# Patient Record
Sex: Female | Born: 1960
Health system: Southern US, Community
[De-identification: ages and names within clinical notes are randomized; demographics above are authoritative.]

## PROBLEM LIST (undated history)

## (undated) DIAGNOSIS — N809 Endometriosis, unspecified: Secondary | ICD-10-CM

## (undated) DIAGNOSIS — R51 Headache: Secondary | ICD-10-CM

## (undated) DIAGNOSIS — G43909 Migraine, unspecified, not intractable, without status migrainosus: Secondary | ICD-10-CM

## (undated) DIAGNOSIS — I219 Acute myocardial infarction, unspecified: Secondary | ICD-10-CM

## (undated) DIAGNOSIS — K649 Unspecified hemorrhoids: Secondary | ICD-10-CM

## (undated) DIAGNOSIS — K219 Gastro-esophageal reflux disease without esophagitis: Secondary | ICD-10-CM

## (undated) DIAGNOSIS — R519 Headache, unspecified: Secondary | ICD-10-CM

## (undated) HISTORY — DX: Unspecified hemorrhoids: K64.9

## (undated) HISTORY — DX: Migraine, unspecified, not intractable, without status migrainosus: G43.909

## (undated) HISTORY — PX: TUBAL LIGATION: SHX77

## (undated) HISTORY — DX: Endometriosis, unspecified: N80.9

---
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
## 1988-11-23 HISTORY — PX: LYSIS OF ADHESION: SHX5961

## 2006-06-17 ENCOUNTER — Encounter: Admission: RE | Admit: 2006-06-17 | Discharge: 2006-06-17 | Payer: Self-pay | Admitting: Family Medicine

## 2006-08-16 ENCOUNTER — Ambulatory Visit: Payer: Self-pay | Admitting: Family Medicine

## 2006-10-19 ENCOUNTER — Ambulatory Visit: Payer: Self-pay

## 2006-10-25 ENCOUNTER — Ambulatory Visit: Payer: Self-pay

## 2007-12-15 ENCOUNTER — Ambulatory Visit: Payer: Self-pay

## 2008-04-18 ENCOUNTER — Ambulatory Visit: Payer: Self-pay | Admitting: Obstetrics and Gynecology

## 2008-05-07 ENCOUNTER — Inpatient Hospital Stay: Payer: Self-pay | Admitting: Obstetrics and Gynecology

## 2008-05-07 HISTORY — PX: ABDOMINAL HYSTERECTOMY: SHX81

## 2010-05-28 ENCOUNTER — Ambulatory Visit: Payer: Self-pay

## 2011-05-07 ENCOUNTER — Ambulatory Visit: Payer: Self-pay | Admitting: Family Medicine

## 2011-06-02 ENCOUNTER — Other Ambulatory Visit: Payer: Self-pay | Admitting: Unknown Physician Specialty

## 2011-06-09 ENCOUNTER — Ambulatory Visit: Payer: Self-pay | Admitting: Unknown Physician Specialty

## 2011-06-24 LAB — HM COLONOSCOPY

## 2011-09-16 ENCOUNTER — Ambulatory Visit: Payer: Self-pay | Admitting: Family Medicine

## 2012-04-14 ENCOUNTER — Ambulatory Visit: Payer: Self-pay | Admitting: Obstetrics and Gynecology

## 2012-04-14 LAB — HEMOGLOBIN: HGB: 13.9 g/dL (ref 12.0–16.0)

## 2012-04-14 LAB — BASIC METABOLIC PANEL
Calcium, Total: 8.7 mg/dL (ref 8.5–10.1)
Chloride: 105 mmol/L (ref 98–107)
Co2: 25 mmol/L (ref 21–32)
Creatinine: 0.61 mg/dL (ref 0.60–1.30)
EGFR (Non-African Amer.): >60
Glucose: 78 mg/dL (ref 65–99)
Sodium: 139 mmol/L (ref 136–145)

## 2012-04-23 HISTORY — PX: OOPHORECTOMY: SHX86

## 2012-04-25 ENCOUNTER — Ambulatory Visit: Payer: Self-pay | Admitting: Obstetrics and Gynecology

## 2013-05-01 ENCOUNTER — Ambulatory Visit: Payer: Self-pay | Admitting: Obstetrics and Gynecology

## 2013-08-10 ENCOUNTER — Ambulatory Visit: Payer: Self-pay | Admitting: Otolaryngology

## 2014-06-05 ENCOUNTER — Ambulatory Visit: Payer: Self-pay | Admitting: Obstetrics and Gynecology

## 2014-06-07 ENCOUNTER — Ambulatory Visit: Payer: Self-pay | Admitting: Obstetrics and Gynecology

## 2015-03-17 NOTE — Op Note (Signed)
PATIENT NAME:  Crystal Higgins, Crystal Higgins MR#:  [id number] DATE OF BIRTH:  July 18, 1961  DATE OF PROCEDURE:  04/25/2012  PREOPERATIVE DIAGNOSIS: Chronic left pelvic pain.   POSTOPERATIVE DIAGNOSES:  1. Chronic left pelvic pain.   2. Extensive pelvic and abdominal adhesions.   PROCEDURES:  1. Laparoscopic left salpingo-oophorectomy.  2. Laparoscopic adhesiolysis incorporating 80% of operating time.   ANESTHESIA: General endotracheal anesthesia.   SURGEON: Boykin Nearing, MD   FIRST ASSISTANT: Ricky L. Amalia Hailey, MD   INDICATIONS: This is a 54 year old gravida 3 para 3 patient with a several month history of left lower quadrant pain. The patient has had extensive surgeries including three Cesarean sections and abdominal hysterectomy.   FINDINGS: Significant abdominal and pelvic adhesions incorporating thick omental adhesions to the anterior abdominal wall, loop of small bowel also tethered and adhesed to the anterior abdominal wall noted.   PROCEDURE: After adequate general endotracheal anesthesia, the patient was placed in the dorsal supine position with the legs in the [place] stirrups. The patient's abdomen and vagina were prepped in a normal sterile fashion. Bladder was drained with a Foley catheter yielding 100 mL of urine. The patient was draped in normal sterile fashion. A sponge was placed in the vagina to be used for manipulation of the bowel during the procedure. A 15 mm infraumbilical incision was made after injecting with 0.5% Marcaine. The laparoscope was advanced into the abdominal cavity without difficulty utilizing the Optiview cannula. The patient's abdomen was insufflated with carbon dioxide. There were thick omental adhesions noted immediately around the infraumbilical port site and to the left lower quadrant and right mid quadrant. A 10 mm port site was then advanced into the abdominal cavity 3 cm medial to the right anterior iliac spine. Direct visualization was used. A 5 mm port was  advanced to the left lower quadrant again approximately 3 cm medial to the anterior iliac spine. Direct visualization was used to place these ports. Again, multiple omental adhesions were noted. Harmonic scalpel was brought up. 80% of the operating time entailed removal of the omental adhesions and dissecting small bowel loops from the anterior abdominal wall. Once the majority of the adhesions were freed, the right ovary was noted. It did not have any significant pathology. The ovary and fallopian tube was placed on medial traction and the infundibulopelvic ligament was cauterized with Kleppingers and then the Harmonic was brought across the base. Left fallopian tube and ovary was then placed into the Endopouch and was removed through the right port site. The patient's right fallopian tube appeared somewhat distorted with some scar tissue and possible hydrosalpinx. Pathology was not significant enough to consider removal of the right tube and ovary since her pain was isolated to the left. The patient's abdomen was copiously irrigated and suctioned. The ureters appeared normal bilaterally with normal peristaltic activity. Good hemostasis was noted. The patient's abdomen was deflated to 6 mmHg. Again, good hemostasis was noted and the procedure was terminated. Port sites removed. The infraumbilical and the right lower port sites were closed with deep 2-0 Vicryl fascial layer and all three port sites were closed with interrupted 4-0 Vicryl. Steri-Strips applied. Sterile dressing applied. Sponge stick was removed. There were no complications.   Estimated blood loss 15 mL. Urine output 150 mL. Intraoperative fluids 700 mL. The Foley catheter was removed at the end of the case. The patient was taken to the recovery room in good condition.   ____________________________ Boykin Nearing, MD tjs:drc D: 04/25/2012 10:43:30 ET  T: 04/25/2012 10:59:50 ET JOB#: [id number]  cc: Boykin Nearing, MD,  <Dictator> Boykin Nearing MD ELECTRONICALLY SIGNED 04/25/2012 16:53

## 2015-04-29 ENCOUNTER — Encounter: Payer: Self-pay | Admitting: *Deleted

## 2015-04-29 ENCOUNTER — Inpatient Hospital Stay: Admission: RE | Admit: 2015-04-29 | Payer: Self-pay | Source: Ambulatory Visit

## 2015-04-29 DIAGNOSIS — Z79899 Other long term (current) drug therapy: Secondary | ICD-10-CM | POA: Diagnosis not present

## 2015-04-29 DIAGNOSIS — Z8249 Family history of ischemic heart disease and other diseases of the circulatory system: Secondary | ICD-10-CM | POA: Diagnosis not present

## 2015-04-29 DIAGNOSIS — Z90710 Acquired absence of both cervix and uterus: Secondary | ICD-10-CM | POA: Diagnosis not present

## 2015-04-29 DIAGNOSIS — Z880 Allergy status to penicillin: Secondary | ICD-10-CM | POA: Diagnosis not present

## 2015-04-29 DIAGNOSIS — Z800 Family history of malignant neoplasm of digestive organs: Secondary | ICD-10-CM | POA: Diagnosis not present

## 2015-04-29 DIAGNOSIS — Z833 Family history of diabetes mellitus: Secondary | ICD-10-CM | POA: Diagnosis not present

## 2015-04-29 DIAGNOSIS — K648 Other hemorrhoids: Secondary | ICD-10-CM | POA: Diagnosis not present

## 2015-04-29 DIAGNOSIS — Z801 Family history of malignant neoplasm of trachea, bronchus and lung: Secondary | ICD-10-CM | POA: Diagnosis not present

## 2015-04-29 DIAGNOSIS — Z885 Allergy status to narcotic agent status: Secondary | ICD-10-CM | POA: Diagnosis not present

## 2015-04-29 DIAGNOSIS — K644 Residual hemorrhoidal skin tags: Secondary | ICD-10-CM | POA: Diagnosis not present

## 2015-04-29 NOTE — Patient Instructions (Signed)
  Your procedure is scheduled on:05/07/15 Report to Day Surgery. To find out your arrival time please call [phone] between 1PM - 3PM on 05/06/15.  Remember: Instructions that are not followed completely may result in serious medical risk, up to and including death, or upon the discretion of your surgeon and anesthesiologist your surgery may need to be rescheduled.    _x___ 1. Do not eat food or drink liquids after midnight. No gum chewing or hard candies.     __x__ 2. No Alcohol for 24 hours before or after surgery.   ____ 3. Bring all medications with you on the day of surgery if instructed.    _x___ 4. Notify your doctor if there is any change in your medical condition     (cold, fever, infections).     Do not wear jewelry, make-up, hairpins, clips or nail polish.  Do not wear lotions, powders, or perfumes. You may wear deodorant.  Do not shave 48 hours prior to surgery. Men may shave face and neck.  Do not bring valuables to the hospital.    [facility] is not responsible for any belongings or valuables.               Contacts, dentures or bridgework may not be worn into surgery.  Leave your suitcase in the car. After surgery it may be brought to your room.  For patients admitted to the hospital, discharge time is determined by your                treatment team.   Patients discharged the day of surgery will not be allowed to drive home.   Please read over the following fact sheets that you were given:   Surgical Site Infection Prevention   ____ Take these medicines the morning of surgery with A SIP OF WATER:    1.   2.   3.   4.  5.  6.  ____ Fleet Enema (as directed)   _x___ Use CHG Soap as directed  ____ Use inhalers on the day of surgery  ____ Stop metformin 2 days prior to surgery    ____ Take 1/2 of usual insulin dose the night before surgery and none on the morning of surgery.   ____ Stop Coumadin/Plavix/aspirin on  ____ Stop Anti-inflammatories on     ____ Stop supplements until after surgery.    ____ Bring C-Pap to the hospital.

## 2015-05-01 ENCOUNTER — Encounter
Admission: RE | Admit: 2015-05-01 | Discharge: 2015-05-01 | Disposition: A | Discharge status: Home / Self Care | Payer: BC Managed Care – PPO | Source: Ambulatory Visit | Attending: Surgery | Admitting: Surgery

## 2015-05-01 DIAGNOSIS — K644 Residual hemorrhoidal skin tags: Secondary | ICD-10-CM | POA: Insufficient documentation

## 2015-05-01 DIAGNOSIS — Z01812 Encounter for preprocedural laboratory examination: Secondary | ICD-10-CM | POA: Insufficient documentation

## 2015-05-01 DIAGNOSIS — K648 Other hemorrhoids: Secondary | ICD-10-CM | POA: Diagnosis not present

## 2015-05-01 LAB — COMPREHENSIVE METABOLIC PANEL
ALBUMIN: 4.2 g/dL (ref 3.5–5.0)
ALT: 16 U/L (ref 14–54)
AST: 20 U/L (ref 15–41)
Alkaline Phosphatase: 44 U/L (ref 38–126)
Anion gap: 9 (ref 5–15)
BUN: 11 mg/dL (ref 6–20)
CALCIUM: 9.3 mg/dL (ref 8.9–10.3)
CHLORIDE: 105 mmol/L (ref 101–111)
CO2: 26 mmol/L (ref 22–32)
Creatinine, Ser: 0.65 mg/dL (ref 0.44–1.00)
GFR calc Af Amer: >60 mL/min (ref >60)
GFR calc non Af Amer: >60 mL/min (ref >60)
Glucose, Bld: 74 mg/dL (ref 65–99)
Potassium: 3.8 mmol/L (ref 3.5–5.1)
Sodium: 140 mmol/L (ref 135–145)
TOTAL PROTEIN: 7.1 g/dL (ref 6.5–8.1)
Total Bilirubin: 0.6 mg/dL (ref 0.3–1.2)

## 2015-05-01 LAB — DIFFERENTIAL
BASOS ABS: 0.2 10*3/uL — AB (ref 0–0.1)
BASOS PCT: 2 %
EOS PCT: 3 %
Eosinophils Absolute: 0.3 10*3/uL (ref 0–0.7)
Lymphocytes Relative: 28 %
Lymphs Abs: 2.8 10*3/uL (ref 1.0–3.6)
MONO ABS: 0.7 10*3/uL (ref 0.2–0.9)
Monocytes Relative: 7 %
NEUTROS ABS: 5.8 10*3/uL (ref 1.4–6.5)
Neutrophils Relative %: 60 %

## 2015-05-01 LAB — CBC
HCT: 41.2 % (ref 35.0–47.0)
HEMOGLOBIN: 13.6 g/dL (ref 12.0–16.0)
MCH: 28.2 pg (ref 26.0–34.0)
MCHC: 33 g/dL (ref 32.0–36.0)
MCV: 85.5 fL (ref 80.0–100.0)
Platelets: 344 10*3/uL (ref 150–440)
RBC: 4.82 MIL/uL (ref 3.80–5.20)
RDW: 13 % (ref 11.5–14.5)
WBC: 9.9 10*3/uL (ref 3.6–11.0)

## 2015-05-07 ENCOUNTER — Ambulatory Visit: Payer: BC Managed Care – PPO | Admitting: Anesthesiology

## 2015-05-07 ENCOUNTER — Encounter: Payer: Self-pay | Admitting: Surgery

## 2015-05-07 ENCOUNTER — Ambulatory Visit
Admission: RE | Admit: 2015-05-07 | Discharge: 2015-05-07 | Disposition: A | Discharge status: Home / Self Care | Payer: BC Managed Care – PPO | Source: Ambulatory Visit | Attending: Surgery | Admitting: Surgery

## 2015-05-07 ENCOUNTER — Encounter
Admission: RE | Disposition: A | Discharge status: Home / Self Care | Payer: Self-pay | Source: Ambulatory Visit | Attending: Surgery

## 2015-05-07 DIAGNOSIS — Z801 Family history of malignant neoplasm of trachea, bronchus and lung: Secondary | ICD-10-CM | POA: Insufficient documentation

## 2015-05-07 DIAGNOSIS — Z8249 Family history of ischemic heart disease and other diseases of the circulatory system: Secondary | ICD-10-CM | POA: Insufficient documentation

## 2015-05-07 DIAGNOSIS — Z885 Allergy status to narcotic agent status: Secondary | ICD-10-CM | POA: Insufficient documentation

## 2015-05-07 DIAGNOSIS — Z79899 Other long term (current) drug therapy: Secondary | ICD-10-CM | POA: Insufficient documentation

## 2015-05-07 DIAGNOSIS — Z833 Family history of diabetes mellitus: Secondary | ICD-10-CM | POA: Insufficient documentation

## 2015-05-07 DIAGNOSIS — Z800 Family history of malignant neoplasm of digestive organs: Secondary | ICD-10-CM | POA: Insufficient documentation

## 2015-05-07 DIAGNOSIS — K648 Other hemorrhoids: Secondary | ICD-10-CM | POA: Diagnosis not present

## 2015-05-07 DIAGNOSIS — Z90710 Acquired absence of both cervix and uterus: Secondary | ICD-10-CM | POA: Insufficient documentation

## 2015-05-07 DIAGNOSIS — K644 Residual hemorrhoidal skin tags: Secondary | ICD-10-CM | POA: Insufficient documentation

## 2015-05-07 DIAGNOSIS — Z880 Allergy status to penicillin: Secondary | ICD-10-CM | POA: Insufficient documentation

## 2015-05-07 HISTORY — DX: Acute myocardial infarction, unspecified: I21.9

## 2015-05-07 HISTORY — PX: HEMORRHOID SURGERY: SHX153

## 2015-05-07 HISTORY — DX: Headache, unspecified: R51.9

## 2015-05-07 HISTORY — DX: Headache: R51

## 2015-05-07 SURGERY — HEMORRHOIDECTOMY PROLAPSED
Anesthesia: General | Wound class: Clean Contaminated

## 2015-05-07 MED ORDER — ACETAMINOPHEN 10 MG/ML IV SOLN
INTRAVENOUS | Status: DC | PRN
Start: 2015-05-07 — End: 2015-05-07
  Administered 2015-05-07: 1000 mg via INTRAVENOUS

## 2015-05-07 MED ORDER — ONDANSETRON HCL 4 MG/2ML IJ SOLN
INTRAMUSCULAR | Status: AC
  Filled 2015-05-07: qty 2

## 2015-05-07 MED ORDER — OXYCODONE-ACETAMINOPHEN 5-325 MG PO TABS: 1.0000 | ORAL_TABLET | ORAL | Status: DC | PRN

## 2015-05-07 MED ORDER — FENTANYL CITRATE (PF) 100 MCG/2ML IJ SOLN
25.0000 ug | INTRAMUSCULAR | Status: DC | PRN
  Administered 2015-05-07 (×2): 25 ug via INTRAVENOUS

## 2015-05-07 MED ORDER — ONDANSETRON HCL 4 MG/2ML IJ SOLN
4.0000 mg | Freq: Once | INTRAMUSCULAR | Status: AC | PRN
  Administered 2015-05-07: 4 mg via INTRAVENOUS

## 2015-05-07 MED ORDER — BUPIVACAINE LIPOSOME 1.3 % IJ SUSP
INTRAMUSCULAR | Status: DC | PRN
  Administered 2015-05-07: 20 mL

## 2015-05-07 MED ORDER — PROPOFOL 10 MG/ML IV BOLUS
INTRAVENOUS | Status: DC | PRN
Start: 2015-05-07 — End: 2015-05-07
  Administered 2015-05-07: 150 mg via INTRAVENOUS
  Administered 2015-05-07: 50 mg via INTRAVENOUS

## 2015-05-07 MED ORDER — LACTATED RINGERS IV SOLN
INTRAVENOUS | Status: DC
  Administered 2015-05-07: 10:00:00 via INTRAVENOUS

## 2015-05-07 MED ORDER — BUPIVACAINE LIPOSOME 1.3 % IJ SUSP
INTRAMUSCULAR | Status: AC
  Filled 2015-05-07: qty 20

## 2015-05-07 MED ORDER — FAMOTIDINE 20 MG PO TABS
ORAL_TABLET | ORAL | Status: AC
  Filled 2015-05-07: qty 1

## 2015-05-07 MED ORDER — ACETAMINOPHEN 10 MG/ML IV SOLN
INTRAVENOUS | Status: AC
  Filled 2015-05-07: qty 100

## 2015-05-07 MED ORDER — MIDAZOLAM HCL 5 MG/5ML IJ SOLN
INTRAMUSCULAR | Status: DC | PRN
  Administered 2015-05-07: 2 mg via INTRAVENOUS

## 2015-05-07 MED ORDER — FENTANYL CITRATE (PF) 100 MCG/2ML IJ SOLN
INTRAMUSCULAR | Status: DC | PRN
Start: 2015-05-07 — End: 2015-05-07
  Administered 2015-05-07 (×2): 50 ug via INTRAVENOUS

## 2015-05-07 MED ORDER — ONDANSETRON HCL 4 MG/2ML IJ SOLN
INTRAMUSCULAR | Status: DC | PRN
Start: 2015-05-07 — End: 2015-05-07
  Administered 2015-05-07: 4 mg via INTRAVENOUS

## 2015-05-07 MED ORDER — LIDOCAINE HCL (CARDIAC) 20 MG/ML IV SOLN
INTRAVENOUS | Status: DC | PRN
  Administered 2015-05-07: 50 mg via INTRAVENOUS

## 2015-05-07 MED ORDER — BUPIVACAINE-EPINEPHRINE (PF) 0.25% -1:200000 IJ SOLN
INTRAMUSCULAR | Status: AC
  Filled 2015-05-07: qty 30

## 2015-05-07 MED ORDER — FENTANYL CITRATE (PF) 100 MCG/2ML IJ SOLN
INTRAMUSCULAR | Status: AC
Start: 2015-05-07 — End: 2015-05-07
  Administered 2015-05-07: 25 ug via INTRAVENOUS
  Filled 2015-05-07: qty 2

## 2015-05-07 MED ORDER — FAMOTIDINE 20 MG PO TABS
20.0000 mg | ORAL_TABLET | Freq: Once | ORAL | Status: AC
  Administered 2015-05-07: 20 mg via ORAL

## 2015-05-07 SURGICAL SUPPLY — 25 items
BLADE SURG 15 STRL LF DISP TIS (BLADE) ×1 IMPLANT
BLADE SURG 15 STRL SS (BLADE) ×1
CANISTER SUCT 1200ML W/VALVE (MISCELLANEOUS) ×2 IMPLANT
DRAPE LAPAROTOMY 100X77 ABD (DRAPES) ×2 IMPLANT
DRAPE LEGGINS SURG 28X43 STRL (DRAPES) ×2 IMPLANT
GAUZE SPONGE 4X4 12PLY STRL (GAUZE/BANDAGES/DRESSINGS) ×2 IMPLANT
GLOVE BIO SURGEON STRL SZ7.5 (GLOVE) ×10 IMPLANT
GOWN STRL REUS W/ TWL LRG LVL3 (GOWN DISPOSABLE) ×3 IMPLANT
GOWN STRL REUS W/TWL LRG LVL3 (GOWN DISPOSABLE) ×3
JELLY LUB 2OZ STRL (MISCELLANEOUS) ×1
JELLY LUBE 2OZ STRL (MISCELLANEOUS) ×1 IMPLANT
LABEL OR SOLS (LABEL) IMPLANT
NDL SAFETY 25GX1.5 (NEEDLE) ×2 IMPLANT
NEEDLE 18GX1X1/2 (RX/OR ONLY) (NEEDLE) ×2 IMPLANT
NS IRRIG 500ML POUR BTL (IV SOLUTION) ×2 IMPLANT
PACK BASIN MINOR ARMC (MISCELLANEOUS) ×2 IMPLANT
PAD GROUND ADULT SPLIT (MISCELLANEOUS) ×2 IMPLANT
PAD PREP 24X41 OB/GYN DISP (PERSONAL CARE ITEMS) ×2 IMPLANT
STAPLER PROXIMATE HCS (STAPLE) IMPLANT
STRAP SAFETY BODY (MISCELLANEOUS) ×2 IMPLANT
SUT CHROMIC 3 0 SH 27 (SUTURE) ×4 IMPLANT
SUT ETH BLK MONO 3 0 FS 1 12/B (SUTURE) IMPLANT
SUT PROLENE 2 0 SH DA (SUTURE) IMPLANT
SYR 20CC LL (SYRINGE) ×2 IMPLANT
SYRINGE 10CC LL (SYRINGE) ×2 IMPLANT

## 2015-05-07 NOTE — Anesthesia Postprocedure Evaluation (Signed)
  Anesthesia Post-op Note  Patient: Crystal Higgins  Procedure(s) Performed: Procedure(s): HEMORRHOIDECTOMY PROLAPSED (N/A)  Anesthesia type:General  Patient location: PACU  Post pain: Pain level controlled  Post assessment: Post-op Vital signs reviewed, Patient's Cardiovascular Status Stable, Respiratory Function Stable, Patent Airway and No signs of Nausea or vomiting  Post vital signs: Reviewed and stable  Last Vitals:  Filed Vitals:   05/07/15 1328  BP:   Pulse: 66  Temp:   Resp: 16    Level of consciousness: awake, alert  and patient cooperative  Complications: No apparent anesthesia complications

## 2015-05-07 NOTE — Anesthesia Preprocedure Evaluation (Signed)
Anesthesia Evaluation  Patient identified by MRN, date of birth, ID band Patient awake    Reviewed: Allergy & Precautions, NPO status , Patient's Chart, lab work & pertinent test results  History of Anesthesia Complications Negative for: history of anesthetic complications  Airway Mallampati: II  TM Distance: >3 FB Neck ROM: Full    Dental  (+) Teeth Intact, Chipped   Pulmonary          Cardiovascular +CHF (at 35 months old 2ndary to pcn dose)     Neuro/Psych    GI/Hepatic GERD- (pepcid OTC)  Medicated,  Endo/Other    Renal/GU      Musculoskeletal   Abdominal   Peds  Hematology   Anesthesia Other Findings   Reproductive/Obstetrics                             Anesthesia Physical Anesthesia Plan  ASA: II  Anesthesia Plan: General   Post-op Pain Management:    Induction: Intravenous  Airway Management Planned: LMA  Additional Equipment:   Intra-op Plan:   Post-operative Plan:   Informed Consent: I have reviewed the patients History and Physical, chart, labs and discussed the procedure including the risks, benefits and alternatives for the proposed anesthesia with the patient or authorized representative who has indicated his/her understanding and acceptance.     Plan Discussed with:   Anesthesia Plan Comments:         Anesthesia Quick Evaluation

## 2015-05-07 NOTE — Transfer of Care (Signed)
Immediate Anesthesia Transfer of Care Note  Patient: Crystal Higgins  Procedure(s) Performed: Procedure(s): HEMORRHOIDECTOMY PROLAPSED (N/A)  Patient Location: PACU  Anesthesia Type:General  Level of Consciousness: sedated  Airway & Oxygen Therapy: Patient Spontanous Breathing and Patient connected to face mask oxygen  Post-op Assessment: Report given to RN and Post -op Vital signs reviewed and stable  Post vital signs: Reviewed and stable  Last Vitals:  Filed Vitals:   05/07/15 1245  BP: 102/64  Pulse: 71  Temp:   Resp: 21    Complications: No apparent anesthesia complications

## 2015-05-07 NOTE — Progress Notes (Signed)
Oral airway upon arrival   Discontinued at 12:56

## 2015-05-07 NOTE — Op Note (Signed)
OPERATIVE REPORT  PREOPERATIVE  DIAGNOSIS: . Hemorrhoids  POSTOPERATIVE DIAGNOSIS: . Hemorrhoids  PROCEDURE: . Internal and external hemorrhoidectomy  ANESTHESIA:  General  SURGEON: Rochel Brome  MD   INDICATIONS: . She reports a history of pain and bleeding. She had physical findings of large internal and external hemorrhoids. Surgery was recommended for definitive treatment.  The patient was placed on the operating table in the supine position under general anesthesia. Legs were elevated into the lithotomy position supported with ankle straps the anal area was prepared with Betadine solution and draped with sterile towels and sheets  Initial inspection revealed multiple external hemorrhoids. Digital exam demonstrated no palpable mass. The anal canal was dilated large enough to admit 2 fingers. The bivalve anal retractor was introduced and further gently dilated the anal canal. There were large internal and external complexes found at the 4:00 and 8:00 and 11:00 positions. There were additional external hemorrhoids identified.  At the 4:00 position the internal component was suture ligated with 3-0 chromic. A V-shaped incision was made externally. The external hemorrhoid was dissected away from surrounding tissues using electrocautery for hemostasis. The dissection was carried up over the internal anal sphincter and dissected up to the previously placed suture ligature. The hemorrhoid was further ligated with 3-0 chromic and excised. Hemostasis appeared to be intact. The wound was closed with a running locked tied 3-0 chromic stitch leaving a small opening externally for drainage.  The  same procedure was carried out at the 8:00 position  The same procedure was carried out at the 11:00 position  At the 1:00 position an external hemorrhoid was excised and repaired with interrupted 3-0 chromic.  At the 6:00 position an external hemorrhoid was excised and repaired with 3-0 chromic.  The  site was further prepared with Betadine solution and infiltrated with 20 cc of Exparel  Dressings were applied with paper tape  The patient appeared to tolerate the procedure satisfactorily and was then prepared for transfer to the recovery room.    Rochel Brome M.D.

## 2015-05-07 NOTE — Discharge Instructions (Addendum)
Take Tylenol or Percocet as needed for pain.  Take stool softener 2 times per day until moving bowels satisfactorily. Later decrease to 1 per day as needed.  Remove dressing later today or tomorrow. Tuck gauze or pad in underwear for drainage and change as needed.  May shower and/or sitting in warm water.AMBULATORY SURGERY  DISCHARGE INSTRUCTIONS   1) The drugs that you were given will stay in your system until tomorrow so for the next 24 hours you should not:  A) Drive an automobile B) Make any legal decisions C) Drink any alcoholic beverage   2) You may resume regular meals tomorrow.  Today it is better to start with liquids and gradually work up to solid foods.  You may eat anything you prefer, but it is better to start with liquids, then soup and crackers, and gradually work up to solid foods.   3) Please notify your doctor immediately if you have any unusual bleeding, trouble breathing, redness and pain at the surgery site, drainage, fever, or pain not relieved by medication. 4)

## 2015-05-07 NOTE — H&P (Signed)
  She was seen preop. She reported no change in overall condition since the day of the office examination.  I discussed the plan for internal and external hemorrhoidectomy  Rochel Brome M.D. 05/07/2015

## 2015-05-07 NOTE — Anesthesia Procedure Notes (Signed)
Procedure Name: LMA Insertion Date/Time: 05/07/2015 11:06 AM Performed by: Christy Sartorius Pre-anesthesia Checklist: Patient identified, Emergency Drugs available, Suction available, Patient being monitored and Timeout performed Patient Re-evaluated:Patient Re-evaluated prior to inductionOxygen Delivery Method: Circle system utilized Preoxygenation: Pre-oxygenation with 100% oxygen Intubation Type: IV induction Ventilation: Mask ventilation without difficulty LMA Size: 3.5 Number of attempts: 1 Placement Confirmation: ETT inserted through vocal cords under direct vision,  positive ETCO2 and breath sounds checked- equal and bilateral Tube secured with: Tape Dental Injury: Teeth and Oropharynx as per pre-operative assessment

## 2015-05-08 LAB — SURGICAL PATHOLOGY

## 2015-05-14 ENCOUNTER — Telehealth: Payer: Self-pay | Admitting: Family Medicine

## 2015-05-14 MED ORDER — CIPROFLOXACIN HCL 500 MG PO TABS: 500.0000 mg | ORAL_TABLET | Freq: Two times a day (BID) | ORAL | Status: AC

## 2015-05-14 NOTE — Telephone Encounter (Signed)
Pt called back to let Dr. Caryn Section know that she does think she has blood in her urine. Thanks TNP

## 2015-05-14 NOTE — Telephone Encounter (Signed)
Pt states she just had surgery a week ago and not able to come in.  Pt states she is having pressure and frequency when voiding.  No burning or pain.  Pt is requesting a Rx to help with this.  The doctor office that did her surgery advised her to call our office.  [facility]  CB#[phone]/[id number]

## 2015-05-14 NOTE — Telephone Encounter (Signed)
Patient reports she has hemorrhoidectomy on 05/07/15 done at [facility] by Dr. Tamala Julian from [facility]. Patient reports that she has been taking Hydrocodone for pain, pt reports she took 1/2 tablet this morning. Patient reports rectal bleeding, chills pt denies fever. Patient is requesting a prescription to treat UTI symptoms or any OTC recommendations. sd

## 2015-05-14 NOTE — Telephone Encounter (Signed)
rx for cipro has been sent to [facility]. Need office visit if any fever, nausea, vomiting, or sx not much better in 2-3 days.

## 2015-05-29 ENCOUNTER — Encounter: Payer: Self-pay | Admitting: Family Medicine

## 2015-05-29 ENCOUNTER — Ambulatory Visit (INDEPENDENT_AMBULATORY_CARE_PROVIDER_SITE_OTHER): Payer: BC Managed Care – PPO | Admitting: Family Medicine

## 2015-05-29 VITALS — BP 100/78 | HR 73 | Temp 98.3°F | Resp 16 | Wt 132.0 lb

## 2015-05-29 DIAGNOSIS — K649 Unspecified hemorrhoids: Secondary | ICD-10-CM | POA: Insufficient documentation

## 2015-05-29 DIAGNOSIS — R1032 Left lower quadrant pain: Secondary | ICD-10-CM | POA: Insufficient documentation

## 2015-05-29 DIAGNOSIS — N898 Other specified noninflammatory disorders of vagina: Secondary | ICD-10-CM | POA: Insufficient documentation

## 2015-05-29 DIAGNOSIS — K1120 Sialoadenitis, unspecified: Secondary | ICD-10-CM | POA: Insufficient documentation

## 2015-05-29 DIAGNOSIS — R300 Dysuria: Secondary | ICD-10-CM

## 2015-05-29 DIAGNOSIS — G43909 Migraine, unspecified, not intractable, without status migrainosus: Secondary | ICD-10-CM | POA: Insufficient documentation

## 2015-05-29 DIAGNOSIS — N809 Endometriosis, unspecified: Secondary | ICD-10-CM | POA: Insufficient documentation

## 2015-05-29 HISTORY — DX: Sialoadenitis, unspecified: K11.20

## 2015-05-29 LAB — POCT URINALYSIS DIPSTICK
BILIRUBIN UA: NEGATIVE
Glucose, UA: NEGATIVE
Ketones, UA: NEGATIVE
Leukocytes, UA: NEGATIVE
Nitrite, UA: NEGATIVE
PH UA: 6
PROTEIN UA: NEGATIVE
Urobilinogen, UA: 0.2

## 2015-05-29 MED ORDER — NITROFURANTOIN MACROCRYSTAL 100 MG PO CAPS: 100.0000 mg | ORAL_CAPSULE | Freq: Two times a day (BID) | ORAL | Status: AC

## 2015-05-29 NOTE — Progress Notes (Signed)
       Patient: Crystal Higgins Female    DOB: 17-Apr-1961   54 y.o.   MRN: [id number] Visit Date: 05/29/2015  Today's Provider: Lelon Huh, MD   Chief Complaint  Patient presents with  . Dysuria    Burning   Subjective:     Patient had hemorrhoidectomy 05/07/2015. Since surgery patient has been having pressure and burning upon urination.  Dysuria  This is a new problem. The current episode started 1 to 4 weeks ago. The problem occurs every urination. The problem has been unchanged. The quality of the pain is described as burning and aching. The pain is mild. There has been no fever. Associated symptoms include hesitancy.      Previous Medications   CHOLECALCIFEROL (VITAMIN D) 1000 UNITS TABLET    Take by mouth.   DOCUSATE SODIUM (COLACE) 100 MG CAPSULE    Take 100 mg by mouth daily.   ISOMETHEPTENE-ACETAMINOPHEN-DICHLORALPHENAZONE (MIDRIN) 65-100-325 MG CAPSULE    Take 2 capsules at onset of headache, then 1 every 30 minutes. Do not exceed 5 capsules in 12 hours   MULTIPLE VITAMIN (MULTIVITAMIN WITH MINERALS) TABS TABLET    Take 1 tablet by mouth daily.   OXYCODONE-ACETAMINOPHEN (ROXICET) 5-325 MG PER TABLET    Take 1-2 tablets by mouth every 4 (four) hours as needed for moderate pain or severe pain.    Review of Systems  Cardiovascular: Negative for chest pain and palpitations.  Gastrointestinal: Negative for abdominal pain, diarrhea, constipation and abdominal distention.  Genitourinary: Positive for dysuria, hesitancy, decreased urine volume and vaginal discharge (mild light yellow discharge for about a week). Negative for pelvic pain.    History  Substance Use Topics  . Smoking status: Never Smoker   . Smokeless tobacco: Never Used  . Alcohol Use: No   Objective:   BP 100/78 mmHg  Pulse 73  Temp(Src) 98.3 F (36.8 C) (Oral)  Resp 16  Wt 132 lb (59.875 kg)  SpO2 97%  Physical Exam General appearance: alert, well developed, well nourished, cooperative and in no  distress Head: Normocephalic, without obvious abnormality, atraumatic Lungs: Respirations even and unlabored Extremities: No gross deformities Abd: No CVAT. Slight suprapubic tenderness.  Skin: Skin color, texture, turgor normal. No rashes seen  Psych: Appropriate mood and affect. Neurologic: Mental status: Alert, oriented to person, place, and time, thought content appropriate.      Assessment & Plan:      1. Dysuria Persistent since recent hemorrhoid surgery. Did improve, but not resolve with cipro was prescribed a few weeks ago. Will put on macrodantin while awaiting culture results.   - POCT urinalysis dipstick - Urine culture - nitrofurantoin (MACRODANTIN) 100 MG capsule; Take 1 capsule (100 mg total) by mouth 2 (two) times daily.  Dispense: 14 capsule; Refill: 0  2. Vaginal discharge Slight discharge for the last week, but had dysuria before then. Consider adding flagyl if cultures are negative, or if not improving on abx.   Follow up: No Follow-up on file.      Lelon Huh, MD  [facility]

## 2015-05-30 LAB — URINE CULTURE: Organism ID, Bacteria: NO GROWTH

## 2015-06-04 ENCOUNTER — Telehealth: Payer: Self-pay

## 2015-06-04 NOTE — Telephone Encounter (Signed)
-----   Message from Birdie Sons, MD sent at 06/04/2015  8:02 AM EDT ----- Please advise patient that urine culture is negative. If symptoms are better since being on antibiotic, then no further evaluation is needed. Otherwise will need referral to urology for dysuria.

## 2015-06-04 NOTE — Telephone Encounter (Signed)
Advised pt of lab results. Pt verbally acknowledges understanding. Pt reports her sx have improved. No need for referral at this time. Renaldo Fiddler, CMA

## 2015-06-11 ENCOUNTER — Encounter: Payer: Self-pay | Admitting: Family Medicine

## 2015-06-11 ENCOUNTER — Ambulatory Visit (INDEPENDENT_AMBULATORY_CARE_PROVIDER_SITE_OTHER): Payer: BC Managed Care – PPO | Admitting: Family Medicine

## 2015-06-11 VITALS — BP 102/68 | HR 72 | Temp 98.1°F | Resp 16 | Ht 63.0 in | Wt 132.0 lb

## 2015-06-11 DIAGNOSIS — K122 Cellulitis and abscess of mouth: Secondary | ICD-10-CM

## 2015-06-11 MED ORDER — LEVOFLOXACIN 500 MG PO TABS: 500.0000 mg | ORAL_TABLET | Freq: Every day | ORAL | Status: AC

## 2015-06-11 NOTE — Progress Notes (Signed)
       Patient: Crystal Higgins Female    DOB: 1961-10-17   54 y.o.   MRN: [id number] Visit Date: 06/11/2015  Today's Provider: Lelon Huh, MD   Chief Complaint  Patient presents with  . Sore Throat   Subjective:    Sore Throat  This is a new problem. The current episode started in the past 7 days. The problem has been gradually worsening. The pain is worse on the right side. There has been no fever. The pain is moderate. Pertinent negatives include no congestion, coughing, ear pain, headaches or shortness of breath. She has tried NSAIDs for the symptoms. The treatment provided mild relief.   She has had several similar episodes in the past for which she has taken levoquin prescribed by Dr. Pryor Ochoa, who is not available now    Allergies  Allergen Reactions  . Codeine Nausea And Vomiting  . Doxycycline Nausea Only  . Penicillins Other (See Comments)    Heart failure @ 15 months old   Previous Medications   CHOLECALCIFEROL (VITAMIN D) 1000 UNITS TABLET    Take by mouth.   DOCUSATE SODIUM (COLACE) 100 MG CAPSULE    Take 100 mg by mouth daily.   ISOMETHEPTENE-ACETAMINOPHEN-DICHLORALPHENAZONE (MIDRIN) 65-100-325 MG CAPSULE    Take 2 capsules at onset of headache, then 1 every 30 minutes. Do not exceed 5 capsules in 12 hours   MULTIPLE VITAMIN (MULTIVITAMIN WITH MINERALS) TABS TABLET    Take 1 tablet by mouth daily.   OXYCODONE-ACETAMINOPHEN (ROXICET) 5-325 MG PER TABLET    Take 1-2 tablets by mouth every 4 (four) hours as needed for moderate pain or severe pain.   POLYETHYLENE GLYCOL POWDER (MIRALAX) POWDER    Take 1 Container by mouth once.    Review of Systems  HENT: Negative for congestion, ear pain and postnasal drip.   Respiratory: Negative for cough and shortness of breath.   Cardiovascular: Negative for chest pain and palpitations.  Neurological: Negative for headaches.    History  Substance Use Topics  . Smoking status: Never Smoker   . Smokeless tobacco: Never Used    . Alcohol Use: No   Objective:   BP 102/68 mmHg  Pulse 72  Temp(Src) 98.1 F (36.7 C) (Oral)  Resp 16  Ht 5\' 3"  (1.6 m)  Wt 132 lb (59.875 kg)  BMI 23.39 kg/m2  SpO2 100%  Physical Exam  General Appearance:    Alert, cooperative, no distress  Eyes:    PERRL, conjunctiva/corneas clear, EOM's intact       Lungs:     Clear to auscultation bilaterally, respirations unlabored  Heart:    Regular rate and rhythm  Neurologic:   Awake, alert, oriented x 3. No apparent focal neurological           defect.   ENT   Mild swelling and erythema of uvula and soft palate on the right  Lymphatic   Mild anterior cervical LAD, L<R        Assessment & Plan:      1. Uvulitis Usually responds well to levaquin prescribed by ENT in the past. Also has prednisone at home to take if swelling worsens.  - levofloxacin (LEVAQUIN) 500 MG tablet; Take 1 tablet (500 mg total) by mouth daily.  Dispense: 7 tablet; Refill: 0         Lelon Huh, MD  [facility]

## 2015-06-12 ENCOUNTER — Telehealth: Payer: Self-pay | Admitting: *Deleted

## 2015-06-12 ENCOUNTER — Telehealth: Payer: Self-pay | Admitting: Family Medicine

## 2015-06-12 MED ORDER — ZITHROMAX Z-PAK 250 MG PO TABS: ORAL_TABLET | ORAL | Status: DC

## 2015-06-12 NOTE — Telephone Encounter (Signed)
Patient contacted office concerning the rx for Levaquin 500 mg that she was prescribed yesterday. Patient stated that she had the med last night, she experienced a flushing sensation that turn into burning and itching on her face. Patient stated that she still has the heat and redness this morning on her face. Patient is requesting a different antibiotic, maybe a z-pack?

## 2015-06-12 NOTE — Telephone Encounter (Signed)
Rx sent to pharmacy. Patient notified. 

## 2015-06-12 NOTE — Telephone Encounter (Signed)
OK to change to Zpack.

## 2015-08-08 ENCOUNTER — Ambulatory Visit (INDEPENDENT_AMBULATORY_CARE_PROVIDER_SITE_OTHER): Payer: BC Managed Care – PPO | Admitting: Family Medicine

## 2015-08-08 ENCOUNTER — Encounter: Payer: Self-pay | Admitting: Family Medicine

## 2015-08-08 VITALS — BP 118/80 | HR 68 | Temp 98.4°F | Resp 16 | Wt 133.6 lb

## 2015-08-08 DIAGNOSIS — J069 Acute upper respiratory infection, unspecified: Secondary | ICD-10-CM | POA: Diagnosis not present

## 2015-08-08 DIAGNOSIS — J029 Acute pharyngitis, unspecified: Secondary | ICD-10-CM

## 2015-08-08 LAB — POCT RAPID STREP A (OFFICE): Rapid Strep A Screen: NEGATIVE

## 2015-08-08 MED ORDER — AZITHROMYCIN 250 MG PO TABS: ORAL_TABLET | ORAL | Status: DC

## 2015-08-08 NOTE — Progress Notes (Signed)
Patient: Crystal Higgins Female    DOB: 11-Dec-1960   54 y.o.   MRN: [id number] Visit Date: 08/08/2015  Today's Provider: Lelon Huh, MD   Chief Complaint  Patient presents with  . Sore Throat   Subjective:    Sore Throat  This is a new problem. The current episode started in the past 7 days. The problem has been gradually worsening. The pain is worse on the right side. There has been no fever. The pain is at a severity of 4/10. The pain is mild. Associated symptoms include congestion (started last night), a hoarse voice, a plugged ear sensation (whole head feels like that) and trouble swallowing. Pertinent negatives include no coughing, ear discharge, ear pain, headaches, shortness of breath, swollen glands or vomiting. She has had no exposure to strep or mono. Treatments tried: Ibuprofen, and Nyquil. The treatment provided no relief.   Throat feels a little better today, but now having sinus congestion, drainage and hoarseness.      Allergies  Allergen Reactions  . Codeine Nausea And Vomiting  . Doxycycline Nausea Only  . Penicillins Other (See Comments)    Heart failure @ 43 months old  . Levaquin [Levofloxacin] Itching and Other (See Comments)    Flushing, Burning   Previous Medications   CHOLECALCIFEROL (VITAMIN D) 1000 UNITS TABLET    Take by mouth.   DOCUSATE SODIUM (COLACE) 100 MG CAPSULE    Take 100 mg by mouth daily.   ISOMETHEPTENE-ACETAMINOPHEN-DICHLORALPHENAZONE (MIDRIN) 65-100-325 MG CAPSULE    Take 2 capsules at onset of headache, then 1 every 30 minutes. Do not exceed 5 capsules in 12 hours   MULTIPLE VITAMIN (MULTIVITAMIN WITH MINERALS) TABS TABLET    Take 1 tablet by mouth daily.   POLYETHYLENE GLYCOL POWDER (MIRALAX) POWDER    Take 1 Container by mouth once.    Review of Systems  Constitutional: Negative for fever, chills, appetite change and fatigue.  HENT: Positive for congestion (started last night), hoarse voice, sinus pressure, sneezing, sore  throat, trouble swallowing and voice change. Negative for ear discharge and ear pain.   Eyes: Positive for pain (pressure in the eye).  Respiratory: Negative for cough, shortness of breath and wheezing.   Cardiovascular: Negative for chest pain and palpitations.  Gastrointestinal: Negative for vomiting.  Musculoskeletal: Negative for arthralgias.  Skin: Negative for rash.  Neurological: Positive for dizziness (a  little). Negative for headaches.  Psychiatric/Behavioral: Positive for sleep disturbance (a little).    Social History  Substance Use Topics  . Smoking status: Never Smoker   . Smokeless tobacco: Never Used  . Alcohol Use: No   Objective:   BP 118/80 mmHg  Pulse 68  Temp(Src) 98.4 F (36.9 C) (Oral)  Resp 16  Wt 133 lb 9.6 oz (60.601 kg)  Physical Exam  General Appearance:    Alert, cooperative, no distress  HENT:   bilateral TM normal without fluid or infection, neck has right anterior cervical nodes enlarged, pharynx erythematous without exudate, sinuses nontender, post nasal drip noted and nasal mucosa congested  Eyes:    PERRL, conjunctiva/corneas clear, EOM's intact       Lungs:     Clear to auscultation bilaterally, respirations unlabored  Heart:    Regular rate and rhythm  Neurologic:   Awake, alert, oriented x 3. No apparent focal neurological           defect.        Results for orders placed or  performed in visit on 08/08/15  POCT rapid strep A  Result Value Ref Range   Rapid Strep A Screen Negative Negative       Assessment & Plan:     1. Sorethroat  - POCT rapid strep A  2. Upper respiratory infection At this point is c/w viral infection. Counseled on s/s of bacterial infection. Given rx for zpack to fill if she does not continue to steadily improve.         Lelon Huh, MD  [facility]

## 2015-08-08 NOTE — Patient Instructions (Signed)
Upper Respiratory Infection, Adult An upper respiratory infection (URI) is also sometimes known as the common cold. The upper respiratory tract includes the nose, sinuses, throat, trachea, and bronchi. Bronchi are the airways leading to the lungs. Most people improve within 1 week, but symptoms can last up to 2 weeks. A residual cough may last even longer.  CAUSES Many different viruses can infect the tissues lining the upper respiratory tract. The tissues become irritated and inflamed and often become very moist. Mucus production is also common. A cold is contagious. You can easily spread the virus to others by oral contact. This includes kissing, sharing a glass, coughing, or sneezing. Touching your mouth or nose and then touching a surface, which is then touched by another person, can also spread the virus. SYMPTOMS  Symptoms typically develop 1 to 3 days after you come in contact with a cold virus. Symptoms vary from person to person. They may include:  Runny nose.  Sneezing.  Nasal congestion.  Sinus irritation.  Sore throat.  Loss of voice (laryngitis).  Cough.  Fatigue.  Muscle aches.  Loss of appetite.  Headache.  Low-grade fever. DIAGNOSIS  You might diagnose your own cold based on familiar symptoms, since most people get a cold 2 to 3 times a year. Your caregiver can confirm this based on your exam. Most importantly, your caregiver can check that your symptoms are not due to another disease such as strep throat, sinusitis, pneumonia, asthma, or epiglottitis. Blood tests, throat tests, and X-rays are not necessary to diagnose a common cold, but they may sometimes be helpful in excluding other more serious diseases. Your caregiver will decide if any further tests are required. RISKS AND COMPLICATIONS  You may be at risk for a more severe case of the common cold if you smoke cigarettes, have chronic heart disease (such as heart failure) or lung disease (such as asthma), or if  you have a weakened immune system. The very young and very old are also at risk for more serious infections. Bacterial sinusitis, middle ear infections, and bacterial pneumonia can complicate the common cold. The common cold can worsen asthma and chronic obstructive pulmonary disease (COPD). Sometimes, these complications can require emergency medical care and may be life-threatening. PREVENTION  The best way to protect against getting a cold is to practice good hygiene. Avoid oral or hand contact with people with cold symptoms. Wash your hands often if contact occurs. There is no clear evidence that vitamin C, vitamin E, echinacea, or exercise reduces the chance of developing a cold. However, it is always recommended to get plenty of rest and practice good nutrition. TREATMENT  Treatment is directed at relieving symptoms. There is no cure. Antibiotics are not effective, because the infection is caused by a virus, not by bacteria. Treatment may include:  Increased fluid intake. Sports drinks offer valuable electrolytes, sugars, and fluids.  Breathing heated mist or steam (vaporizer or shower).  Eating chicken soup or other clear broths, and maintaining good nutrition.  Getting plenty of rest.  Using gargles or lozenges for comfort.  Controlling fevers with ibuprofen or acetaminophen as directed by your caregiver.  Increasing usage of your inhaler if you have asthma. Zinc gel and zinc lozenges, taken in the first 24 hours of the common cold, can shorten the duration and lessen the severity of symptoms. Pain medicines may help with fever, muscle aches, and throat pain. A variety of non-prescription medicines are available to treat congestion and runny nose. Your caregiver   can make recommendations and may suggest nasal or lung inhalers for other symptoms.  HOME CARE INSTRUCTIONS   Only take over-the-counter or prescription medicines for pain, discomfort, or fever as directed by your  caregiver.  Use a warm mist humidifier or inhale steam from a shower to increase air moisture. This may keep secretions moist and make it easier to breathe.  Drink enough water and fluids to keep your urine clear or pale yellow.  Rest as needed.  Return to work when your temperature has returned to normal or as your caregiver advises. You may need to stay home longer to avoid infecting others. You can also use a face mask and careful hand washing to prevent spread of the virus. SEEK MEDICAL CARE IF:   After the first few days, you feel you are getting worse rather than better.  You need your caregiver's advice about medicines to control symptoms.  You develop chills, worsening shortness of breath, or brown or red sputum. These may be signs of pneumonia.  You develop yellow or brown nasal discharge or pain in the face, especially when you bend forward. These may be signs of sinusitis.  You develop a fever, swollen neck glands, pain with swallowing, or white areas in the back of your throat. These may be signs of strep throat. SEEK IMMEDIATE MEDICAL CARE IF:   You have a fever.  You develop severe or persistent headache, ear pain, sinus pain, or chest pain.  You develop wheezing, a prolonged cough, cough up blood, or have a change in your usual mucus (if you have chronic lung disease).  You develop sore muscles or a stiff neck. Document Released: 05/05/2001 Document Revised: 02/01/2012 Document Reviewed: 02/14/2014 Exit[employer] Patient Information 2015 [employer]. This information is not intended to replace advice given to you by your health care provider. Make sure you discuss any questions you have with your health care provider.  

## 2015-10-01 ENCOUNTER — Other Ambulatory Visit: Payer: Self-pay | Admitting: Obstetrics and Gynecology

## 2015-10-01 DIAGNOSIS — Z1231 Encounter for screening mammogram for malignant neoplasm of breast: Secondary | ICD-10-CM

## 2015-10-08 ENCOUNTER — Ambulatory Visit
Admission: RE | Admit: 2015-10-08 | Discharge: 2015-10-08 | Disposition: A | Discharge status: Home / Self Care | Payer: BLUE CROSS/BLUE SHIELD | Source: Ambulatory Visit | Attending: Obstetrics and Gynecology | Admitting: Obstetrics and Gynecology

## 2015-10-08 DIAGNOSIS — Z1231 Encounter for screening mammogram for malignant neoplasm of breast: Secondary | ICD-10-CM | POA: Diagnosis present

## 2015-10-11 ENCOUNTER — Encounter: Payer: Self-pay | Admitting: Family Medicine

## 2015-10-11 ENCOUNTER — Ambulatory Visit (INDEPENDENT_AMBULATORY_CARE_PROVIDER_SITE_OTHER): Payer: BLUE CROSS/BLUE SHIELD | Admitting: Family Medicine

## 2015-10-11 VITALS — BP 110/62 | HR 72 | Temp 98.3°F | Resp 16 | Wt 135.0 lb

## 2015-10-11 DIAGNOSIS — M7552 Bursitis of left shoulder: Secondary | ICD-10-CM | POA: Diagnosis not present

## 2015-10-11 DIAGNOSIS — M7550 Bursitis of unspecified shoulder: Secondary | ICD-10-CM | POA: Insufficient documentation

## 2015-10-11 HISTORY — DX: Bursitis of unspecified shoulder: M75.50

## 2015-10-11 MED ORDER — MELOXICAM 15 MG PO TABS: 15.0000 mg | ORAL_TABLET | Freq: Every day | ORAL | Status: DC

## 2015-10-11 NOTE — Progress Notes (Signed)
Patient ID: Crystal Higgins, female   DOB: [birth year], 54 y.o.   MRN: [id number]       Patient: Crystal Higgins Female    DOB: 1961-07-17   54 y.o.   MRN: [id number] Visit Date: 10/11/2015  Today's Provider: Lelon Huh, MD   Chief Complaint  Patient presents with  . Shoulder Pain    x 2 weeks.    Subjective:    Shoulder Pain  The pain is present in the left shoulder. This is a new problem. The current episode started 1 to 4 weeks ago. There has been no history of extremity trauma. The problem occurs intermittently. The problem has been gradually worsening. The quality of the pain is described as dull and aching. The pain is moderate (can be severe). Associated symptoms include an inability to bear weight, a limited range of motion, numbness, stiffness and tingling. She has tried NSAIDS for the symptoms. The treatment provided no relief.      Allergies  Allergen Reactions  . Codeine Nausea And Vomiting  . Doxycycline Nausea Only  . Penicillins Other (See Comments)    Heart failure @ 69 months old  . Levaquin [Levofloxacin] Itching and Other (See Comments)    Flushing, Burning   Previous Medications   CHOLECALCIFEROL (VITAMIN D) 1000 UNITS TABLET    Take by mouth.   DOCUSATE SODIUM (COLACE) 100 MG CAPSULE    Take 100 mg by mouth daily.   ISOMETHEPTENE-ACETAMINOPHEN-DICHLORALPHENAZONE (MIDRIN) 65-100-325 MG CAPSULE    Take 2 capsules at onset of headache, then 1 every 30 minutes. Do not exceed 5 capsules in 12 hours   MULTIPLE VITAMIN (MULTIVITAMIN WITH MINERALS) TABS TABLET    Take 1 tablet by mouth daily.   POLYETHYLENE GLYCOL POWDER (MIRALAX) POWDER    Take 1 Container by mouth once.   VITAMIN E 400 UNIT CAPSULE    Take 400 Units by mouth every other day.    Review of Systems  Constitutional: Negative.   Musculoskeletal: Positive for myalgias and stiffness.  Neurological: Positive for tingling and numbness.    Social History  Substance Use Topics  . Smoking status:  Never Smoker   . Smokeless tobacco: Never Used  . Alcohol Use: No   Objective:   BP 110/62 mmHg  Pulse 72  Temp(Src) 98.3 F (36.8 C)  Resp 16  Wt 135 lb (61.236 kg)  Physical Exam  General appearance: alert, well developed, well nourished, cooperative and in no distress Head: Normocephalic, without obvious abnormality, atraumatic Lungs: Respirations even and unlabored Extremities: No gross deformities Skin: Skin color, texture, turgor normal. No rashes seen  Psych: Appropriate mood and affect. Neurologic: Mental status: Alert, oriented to person, place, and time, thought content appropriate. MS: Moderate tenderness around acromium. Positive impingement sign. Abduction limited to 45 degrees. No erythema.     Assessment & Plan:     1. Subacromial bursitis, left Patient Instructions  Apply ice for about 8 minutes every 4-6 hours for the next 3 days.    - meloxicam (MOBIC) 15 MG tablet; Take 1 tablet (15 mg total) by mouth daily.  Dispense: 30 tablet; Refill: 0  Call for orthopedic referral if not greatly improved in a week.       Lelon Huh, MD  [facility]

## 2015-10-11 NOTE — Patient Instructions (Addendum)
Apply ice for about 8 minutes every 4-6 hours for the next 3 days.

## 2015-10-25 ENCOUNTER — Telehealth: Payer: Self-pay | Admitting: Family Medicine

## 2015-10-25 NOTE — Telephone Encounter (Signed)
Patient notified. Patient expressed understanding.  

## 2015-10-25 NOTE — Telephone Encounter (Signed)
She can change Pepcid to Priolosec OTC (or generic omeprazole) once a day. It does better at protecting stomach than Pepcid Regular Excedrin has aspirin, which can also irritated her stomach. She may want to change to the Aspirin Free Excedrin.

## 2015-10-25 NOTE — Telephone Encounter (Signed)
Pt called saying she has been taking the meloxicam (MOBIC) 15 MG tablet 11/18.  It is helping her arm but seems to be irratating her stomach.  Acid reflux, burning ect.  Is there anything she can take besides pepcid.  She also takes medication for headache.  She take Excedrine every day too.  She uses [facility].  Call back is [phone]  Thanks Con Memos

## 2015-10-28 ENCOUNTER — Telehealth: Payer: Self-pay | Admitting: Family Medicine

## 2015-10-28 MED ORDER — OMEPRAZOLE 20 MG PO CPDR: 20.0000 mg | DELAYED_RELEASE_CAPSULE | Freq: Every day | ORAL | Status: DC

## 2015-10-28 NOTE — Telephone Encounter (Signed)
Pt stated that Dr. Caryn Section advised her to try OTC medication but would like Omeprazole called into [facility] b/c insurance will cover and it will only cost her 4$. Thanks TNP

## 2015-10-28 NOTE — Telephone Encounter (Signed)
Done

## 2015-10-28 NOTE — Telephone Encounter (Signed)
Advised patient as below.  

## 2015-11-01 ENCOUNTER — Telehealth: Payer: Self-pay | Admitting: Family Medicine

## 2015-11-01 DIAGNOSIS — M25519 Pain in unspecified shoulder: Secondary | ICD-10-CM | POA: Insufficient documentation

## 2015-11-01 NOTE — Telephone Encounter (Signed)
Please refer orthopedics for shoulder pain

## 2015-11-01 NOTE — Telephone Encounter (Signed)
Pt states she was in 3 weeks ago for left shoulder pain.  Pt states the Rx did help some but she still can not lift her arm and still having numbness.  Pt states she was told she may need a shot.  Pt is asking if she will need a referral to have the shot or can that be done in our office.  CB#[phone]/MW

## 2015-12-17 ENCOUNTER — Other Ambulatory Visit: Payer: Self-pay | Admitting: Orthopedic Surgery

## 2015-12-17 DIAGNOSIS — M25512 Pain in left shoulder: Secondary | ICD-10-CM

## 2016-01-03 ENCOUNTER — Other Ambulatory Visit: Payer: Self-pay | Admitting: Family Medicine

## 2016-01-06 ENCOUNTER — Telehealth: Payer: Self-pay | Admitting: Family Medicine

## 2016-01-06 NOTE — Telephone Encounter (Signed)
Please advice  

## 2016-01-06 NOTE — Telephone Encounter (Signed)
Teari with [facility] states the Rx for isometheptene-acetaminophen-dichloralphenazone (MIDRIN) 65-100-325 MG capsule is not covered by insurance and will $100.00.  The pharmacy is requesting a different Rx.  Pt is may have to have shoulder surgery so she does not want to take aprin if possible.  [facility].  CB#[phone]/MW

## 2016-01-06 NOTE — Telephone Encounter (Signed)
Rx called in to pharmacy. 

## 2016-01-07 ENCOUNTER — Ambulatory Visit
Admission: RE | Admit: 2016-01-07 | Discharge: 2016-01-07 | Disposition: A | Discharge status: Home / Self Care | Payer: BLUE CROSS/BLUE SHIELD | Source: Ambulatory Visit | Attending: Orthopedic Surgery | Admitting: Orthopedic Surgery

## 2016-01-07 DIAGNOSIS — M25812 Other specified joint disorders, left shoulder: Secondary | ICD-10-CM | POA: Insufficient documentation

## 2016-01-07 DIAGNOSIS — M25512 Pain in left shoulder: Secondary | ICD-10-CM

## 2016-01-07 DIAGNOSIS — M67814 Other specified disorders of tendon, left shoulder: Secondary | ICD-10-CM | POA: Insufficient documentation

## 2016-01-07 MED ORDER — IOHEXOL 180 MG/ML  SOLN: 10.0000 mL | Freq: Once | INTRAMUSCULAR | Status: DC | PRN

## 2016-01-07 MED ORDER — GADOBENATE DIMEGLUMINE 529 MG/ML IV SOLN: 0.1000 mL | Freq: Once | INTRAVENOUS | Status: DC | PRN

## 2016-01-07 MED ORDER — BUTALBITAL-APAP-CAFFEINE 50-325-40 MG PO TABS: 1.0000 | ORAL_TABLET | Freq: Four times a day (QID) | ORAL | Status: DC | PRN

## 2016-01-07 NOTE — Telephone Encounter (Signed)
Can call in Fioricet

## 2016-01-07 NOTE — Telephone Encounter (Signed)
Rx called in to pharmacy. 

## 2016-01-08 ENCOUNTER — Ambulatory Visit: Payer: BC Managed Care – PPO

## 2016-01-10 ENCOUNTER — Encounter: Payer: Self-pay | Admitting: Family Medicine

## 2016-01-10 ENCOUNTER — Ambulatory Visit (INDEPENDENT_AMBULATORY_CARE_PROVIDER_SITE_OTHER): Payer: BLUE CROSS/BLUE SHIELD | Admitting: Family Medicine

## 2016-01-10 VITALS — BP 110/80 | HR 79 | Temp 98.3°F | Resp 16 | Ht 63.0 in | Wt 134.0 lb

## 2016-01-10 DIAGNOSIS — J029 Acute pharyngitis, unspecified: Secondary | ICD-10-CM

## 2016-01-10 MED ORDER — AZITHROMYCIN 250 MG PO TABS: ORAL_TABLET | ORAL | Status: AC

## 2016-01-10 NOTE — Progress Notes (Signed)
Patient: Crystal Higgins Female    DOB: October 15, 1961   55 y.o.   MRN: [id number] Visit Date: 01/10/2016  Today's Provider: Lelon Huh, MD   Chief Complaint  Patient presents with  . Sore Throat   Subjective:    Sore Throat  This is a recurrent problem. The current episode started today. The problem has been gradually worsening. The pain is worse on the left side. There has been no fever. The pain is moderate. Associated symptoms include ear pain, swollen glands and trouble swallowing. Pertinent negatives include no abdominal pain, congestion, coughing, diarrhea, drooling, ear discharge, headaches, hoarse voice, neck pain, shortness of breath, stridor or vomiting. She has had exposure to mono. She has had no exposure to strep. She has tried nothing for the symptoms. The treatment provided no relief.    Left ear pain yesterday. Swollen and sore throat this morning. Hard to swallow. No fever. Has a history of recurrent uveitis requiring antibiotic for which has been followed by Dr. Pryor Ochoa in the past.    Allergies  Allergen Reactions  . Codeine Nausea And Vomiting  . Doxycycline Nausea Only  . Penicillins Other (See Comments)    Heart failure @ 64 months old  . Levaquin [Levofloxacin] Itching and Other (See Comments)    Flushing, Burning   Previous Medications   BUTALBITAL-ACETAMINOPHEN-CAFFEINE (FIORICET) 50-325-40 MG TABLET    Take 1-2 tablets by mouth every 6 (six) hours as needed for headache.   CHOLECALCIFEROL (VITAMIN D) 1000 UNITS TABLET    Take by mouth.   DOCUSATE SODIUM (COLACE) 100 MG CAPSULE    Take 100 mg by mouth daily.   MULTIPLE VITAMIN (MULTIVITAMIN WITH MINERALS) TABS TABLET    Take 1 tablet by mouth daily.   OMEPRAZOLE (PRILOSEC) 20 MG CAPSULE    Take 1 capsule (20 mg total) by mouth daily.   POLYETHYLENE GLYCOL POWDER (MIRALAX) POWDER    Take 1 Container by mouth once.   VITAMIN E 400 UNIT CAPSULE    Take 400 Units by mouth every other day.    Review of  Systems  Constitutional: Negative for fever, chills, appetite change and fatigue.  HENT: Positive for ear pain, sore throat and trouble swallowing. Negative for congestion, drooling, ear discharge and hoarse voice.   Respiratory: Negative for cough, chest tightness, shortness of breath and stridor.   Cardiovascular: Negative for chest pain and palpitations.  Gastrointestinal: Negative for nausea, vomiting, abdominal pain and diarrhea.  Musculoskeletal: Negative for neck pain.  Neurological: Negative for dizziness, weakness and headaches.    Social History  Substance Use Topics  . Smoking status: Never Smoker   . Smokeless tobacco: Never Used  . Alcohol Use: No   Objective:   BP 110/80 mmHg  Pulse 79  Temp(Src) 98.3 F (36.8 C) (Oral)  Resp 16  Ht 5\' 3"  (1.6 m)  Wt 134 lb (60.782 kg)  BMI 23.74 kg/m2  SpO2 97%  Physical Exam  General Appearance:    Alert, cooperative, no distress  HENT:   ENT exam normal, no neck nodes or sinus tenderness  Eyes:    PERRL, conjunctiva/corneas clear, EOM's intact       Lungs:     Clear to auscultation bilaterally, respirations unlabored  Heart:    Regular rate and rhythm  Neurologic:   Awake, alert, oriented x 3. No apparent focal neurological           defect.  Assessment & Plan:     1. Sorethroat Now improved. Counseled that this is likely viral and to treat symptomatically. She may start azithromycin if symptoms progress over the weekend.   - azithromycin (ZITHROMAX) 250 MG tablet; 2 by mouth today, then 1 daily for 4 days  Dispense: 6 tablet; Refill: 0       Lelon Huh, MD  [facility]

## 2016-01-20 ENCOUNTER — Other Ambulatory Visit: Payer: Self-pay | Admitting: Orthopedic Surgery

## 2016-01-20 DIAGNOSIS — M25512 Pain in left shoulder: Secondary | ICD-10-CM

## 2016-01-30 ENCOUNTER — Ambulatory Visit
Admission: RE | Admit: 2016-01-30 | Discharge: 2016-01-30 | Disposition: A | Discharge status: Home / Self Care | Payer: BLUE CROSS/BLUE SHIELD | Source: Ambulatory Visit | Attending: Orthopedic Surgery | Admitting: Orthopedic Surgery

## 2016-01-30 DIAGNOSIS — M25512 Pain in left shoulder: Secondary | ICD-10-CM | POA: Insufficient documentation

## 2016-01-30 HISTORY — DX: Gastro-esophageal reflux disease without esophagitis: K21.9

## 2016-01-30 MED ORDER — IOHEXOL 180 MG/ML  SOLN: 5.0000 mL | Freq: Once | INTRAMUSCULAR | Status: DC | PRN

## 2016-01-30 NOTE — Discharge Instructions (Signed)
Facet Joint Block, Care After Refer to this sheet in the next few weeks. These instructions provide you with information on caring for yourself after your procedure. Your health care provider may also give you more specific instructions. Your treatment has been planned according to current medical practices, but problems sometimes occur. Call your health care provider if you have any problems or questions after your procedure. HOME CARE INSTRUCTIONS   Keep track of the amount of pain relief you feel and how long it lasts.  Limit pain medicine within the first 4-6 hours after the procedure as directed by your health care provider.  Resume taking dietary supplements and medicines as directed by your health care provider.  You may resume your regular diet.  Do not apply heat near or over the injection site(s) for 24 hours.   Do not take a bath or soak in water (such as a pool or lake) for 24 hours.  Do not drive for 24 hours unless approved by your health care provider.  Avoid strenuous activity for 24 hours.  Remove your bandages the morning after the procedure.   If the injection site is tender, applying an ice pack may relieve some tenderness. To do this:  Put ice in a bag.  Place a towel between your skin and the bag.  Leave the ice on for 15-20 minutes, 3-4 times a day.  Keep follow-up appointments as directed by your health care provider. SEEK MEDICAL CARE IF:   Your pain is not controlled by your medicines.   There is drainage from the injection site.   There is significant bleeding or swelling at the injection site.  You have diabetes and your blood sugar is above 180 mg/dL. SEEK IMMEDIATE MEDICAL CARE IF:   You develop a fever of 101F (38.3C) or greater.   You have worsening pain or swelling around the injection site.   You have red streaking around the injection site.   You develop severe pain that is not controlled by your medicines.   You develop  a headache, stiff neck, nausea, or vomiting.   Your eyes become very sensitive to light.   You have weakness, paralysis, or tingling in your arms or legs that was not present before the procedure.   You develop difficulty urinating or breathing.    This information is not intended to replace advice given to you by your health care provider. Make sure you discuss any questions you have with your health care provider.   Document Released: 10/26/2012 Document Revised: 11/30/2014 Document Reviewed: 10/26/2012 Elsevier Interactive Patient Education 2016 [employer]  

## 2016-05-04 ENCOUNTER — Other Ambulatory Visit: Payer: Self-pay | Admitting: Family Medicine

## 2016-06-04 IMAGING — RF DG FLUORO GUIDE NDL PLC/BX
1 series · 1 of 1 positions shown · non-contrast
Comparison: none

CLINICAL DATA: Left shoulder pain, diagnosed with frozen shoulder
as well as bursitis in September 2015

[Series 2: fluoro_iodine_singleshot_bw · 0.17mm/px · 1 of 1 slices shown]
[im 1/1]
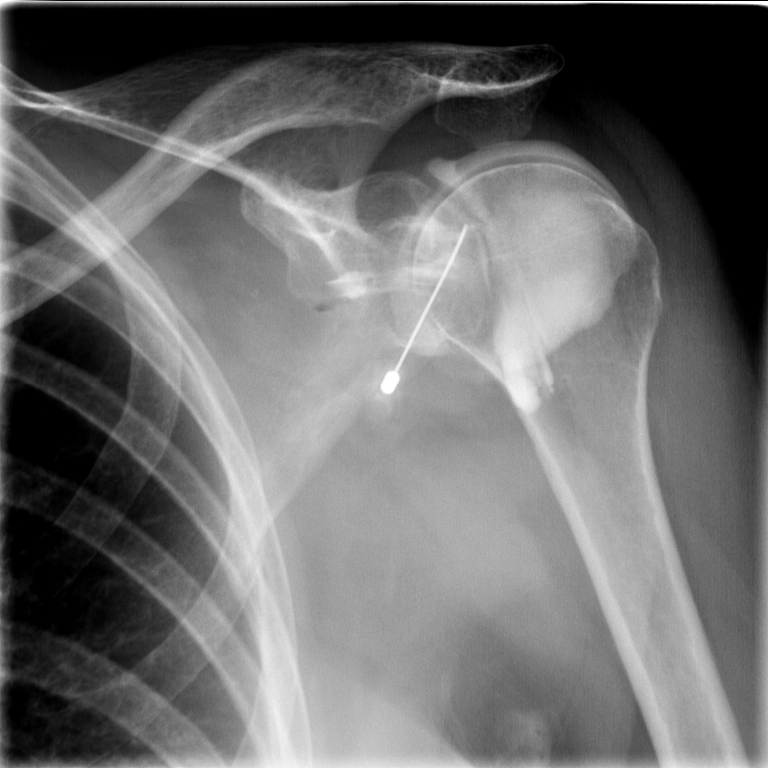

[1 of 1 positions shown; findings below may reference images not displayed]

EXAM:
Left shoulder INJECTION UNDER FLUOROSCOPY

FLUOROSCOPY TIME:  Fluoroscopy Time (in minutes and seconds): 0
minutes, 30 seconds

Number of Acquired Images:  1

PROCEDURE:
The anticipated procedure was discussed with the patient. The
patient voiced her willingness to proceed. The skin of the left
upper arm was marked with an indelible marker. A time-out procedure
was called. Overlying skin prepped with Betadine, draped in the
usual sterile fashion, and infiltrated locally with buffered
Lidocaine. A 22 gauge spinal needle advanced to the superior medial
margin of the left humeral head. 1 ml of Lidocaine injected easily.
Diagnostic injection of iodinated contrastdemonstrates
intra-articular spread without intravascular component.

80mg methylprednisolone and 7 ml Sensorcaine 0.5% were then
administered. No immediate complication.
IMPRESSION: The patient tolerated the procedure well

Technically successful left shoulder injection under fluoroscopy.

## 2016-06-29 NOTE — Telephone Encounter (Signed)
error 

## 2016-10-09 ENCOUNTER — Other Ambulatory Visit: Payer: Self-pay | Admitting: Family Medicine

## 2016-10-09 MED ORDER — ISOMETHEPTENE-DICHLORAL-APAP 65-100-325 MG PO CAPS: 1.0000 | ORAL_CAPSULE | Freq: Four times a day (QID) | ORAL | 3 refills | Status: DC | PRN

## 2016-10-20 ENCOUNTER — Other Ambulatory Visit: Payer: Self-pay | Admitting: Obstetrics and Gynecology

## 2016-10-20 DIAGNOSIS — Z1231 Encounter for screening mammogram for malignant neoplasm of breast: Secondary | ICD-10-CM

## 2016-10-20 LAB — HM PAP SMEAR: HM Pap smear: NEGATIVE

## 2016-10-20 LAB — RESULTS CONSOLE HPV: CHL HPV: NEGATIVE

## 2016-11-17 ENCOUNTER — Ambulatory Visit (INDEPENDENT_AMBULATORY_CARE_PROVIDER_SITE_OTHER): Payer: BLUE CROSS/BLUE SHIELD | Admitting: Family Medicine

## 2016-11-17 ENCOUNTER — Encounter: Payer: Self-pay | Admitting: Family Medicine

## 2016-11-17 VITALS — BP 112/72 | HR 66 | Temp 98.5°F | Resp 16 | Wt 136.0 lb

## 2016-11-17 DIAGNOSIS — L989 Disorder of the skin and subcutaneous tissue, unspecified: Secondary | ICD-10-CM | POA: Diagnosis not present

## 2016-11-17 DIAGNOSIS — G43809 Other migraine, not intractable, without status migrainosus: Secondary | ICD-10-CM

## 2016-11-17 MED ORDER — NORTRIPTYLINE HCL 10 MG PO CAPS: 10.0000 mg | ORAL_CAPSULE | Freq: Every day | ORAL | 3 refills | Status: DC

## 2016-11-17 NOTE — Progress Notes (Signed)
Patient: Crystal Higgins Female    DOB: July 20, 1961   55 y.o.   MRN: [id number] Visit Date: 11/17/2016  Today's Provider: Lelon Huh, MD   Chief Complaint  Patient presents with  . Cyst  . headaches   Subjective:    HPI Patient comes in today c/o lesion on her lower right arm. She reports that the cyst has been there over 1 year. She reports it was evaluated by dermatology about a year ago and was frozen, but is came back several weeks later. Patient denies any pain or tenderness. She reports that it occasionally gets red, especially when she bumps it. She denies any increase in size or drainage.   Patient also mentions that her migraine headaches are becoming more frequent. Patient reports that she takes Excedrin daily and Fioricet as needed. Patient wanted to discuss if there is a better maintenance medication she could take for migraines instead of Excedrin due to it bothering her stomach. She took amitriptyline several years ago which was effective, but when she increase to 2 x 10mg  tablets she started having very dry mouth and had to stop.     Allergies  Allergen Reactions  . Codeine Nausea And Vomiting  . Doxycycline Nausea Only  . Penicillins Other (See Comments)    Heart failure @ 31 months old  . Levaquin [Levofloxacin] Itching and Other (See Comments)    Flushing, Burning     Current Outpatient Prescriptions:  .  butalbital-acetaminophen-caffeine (FIORICET) 50-325-40 MG tablet, Take 1-2 tablets by mouth every 6 (six) hours as needed for headache., Disp: 30 tablet, Rfl: 3 .  cholecalciferol (VITAMIN D) 1000 UNITS tablet, Take by mouth., Disp: , Rfl:  .  COD LIVER OIL PO, Take by mouth., Disp: , Rfl:  .  Multiple Vitamin (MULTIVITAMIN WITH MINERALS) TABS tablet, Take 1 tablet by mouth daily., Disp: , Rfl:  .  omeprazole (PRILOSEC) 20 MG capsule, TAKE 1 CAPSULE BY MOUTH EVERY DAY, Disp: 90 capsule, Rfl: 4 .  polyethylene glycol powder (MIRALAX) powder, Take 1  Container by mouth once., Disp: , Rfl:  .  vitamin E 400 UNIT capsule, Take 400 Units by mouth every other day., Disp: , Rfl:  .  docusate sodium (COLACE) 100 MG capsule, Take 100 mg by mouth daily., Disp: , Rfl:  .  isometheptene-acetaminophen-dichloralphenazone (MIDRIN) 65-100-325 MG capsule, Take 1 capsule by mouth 4 (four) times daily as needed for migraine. Maximum 5 capsules in 12 hours for migraine headaches, 8 capsules in 24 hours for tension headaches., Disp: 30 capsule, Rfl: 3  Review of Systems  Constitutional: Negative.   Cardiovascular: Negative for chest pain, palpitations and leg swelling.  Musculoskeletal: Negative for myalgias.  Skin: Positive for color change. Negative for rash.  Neurological: Positive for headaches. Negative for dizziness.    Social History  Substance Use Topics  . Smoking status: Never Smoker  . Smokeless tobacco: Never Used  . Alcohol use No   Objective:   BP 112/72 (BP Location: Left Arm, Patient Position: Sitting, Cuff Size: Normal)   Pulse 66   Temp 98.5 F (36.9 C)   Resp 16   Wt 136 lb (61.7 kg)   SpO2 98%   BMI 24.09 kg/m   Physical Exam  General appearance: alert, well developed, well nourished, cooperative and in no distress Head: Normocephalic, without obvious abnormality, atraumatic Respiratory: Respirations even and unlabored, normal respiratory rate  Skin: About 31mm round scaly non-inflamed lesion dorsum of right lower  mid forearm. Well circumscribed and not pigmented.  Psych: Appropriate mood and affect. Neurologic: Mental status: Alert, oriented to person, place, and time, thought content appropriate.     Assessment & Plan:      1. Skin lesion Frozen x 2 with cryopen. Will recheck in one month Advised these often require more than one treatment. Alternative is to return to dermatology for excision  2. Other migraine without status migrainosus, not intractable Try: - nortriptyline (PAMELOR) 10 MG capsule; Take 1-2  capsules (10-20 mg total) by mouth at bedtime.  Dispense: 30 capsule; Refill: 3      Lelon Huh, MD  [facility]

## 2016-11-26 ENCOUNTER — Ambulatory Visit: Payer: BLUE CROSS/BLUE SHIELD

## 2016-12-01 ENCOUNTER — Telehealth: Payer: Self-pay

## 2016-12-01 DIAGNOSIS — G43809 Other migraine, not intractable, without status migrainosus: Secondary | ICD-10-CM

## 2016-12-01 MED ORDER — TOPIRAMATE 25 MG PO TABS: ORAL_TABLET | ORAL | 1 refills | Status: DC

## 2016-12-01 NOTE — Telephone Encounter (Signed)
Medication sent to [facility] and pt advised. Renaldo Fiddler, CMA

## 2016-12-01 NOTE — Telephone Encounter (Signed)
Pt reports the Nortriptyline that was prescribed on 11/17/2016 for migraines is causing dizziness. States she is taking the medication at night, but can barely function during the day due to the dizziness. Please advise. Renaldo Fiddler, CMA

## 2016-12-01 NOTE — Telephone Encounter (Signed)
Can try change to topiramate 25mg  QHS to 7 days, then increase to 2 QHS. #60. 1 refill. This is a completely different class of medications for headaches and should not cause similar side effects.

## 2016-12-04 ENCOUNTER — Ambulatory Visit (INDEPENDENT_AMBULATORY_CARE_PROVIDER_SITE_OTHER): Payer: BLUE CROSS/BLUE SHIELD | Admitting: Family Medicine

## 2016-12-04 ENCOUNTER — Encounter: Payer: Self-pay | Admitting: Family Medicine

## 2016-12-04 VITALS — BP 138/80 | HR 79 | Temp 98.2°F | Resp 16 | Wt 131.0 lb

## 2016-12-04 DIAGNOSIS — J029 Acute pharyngitis, unspecified: Secondary | ICD-10-CM | POA: Diagnosis not present

## 2016-12-04 LAB — POCT RAPID STREP A (OFFICE): Rapid Strep A Screen: NEGATIVE

## 2016-12-04 MED ORDER — AZITHROMYCIN 250 MG PO TABS: ORAL_TABLET | ORAL | 0 refills | Status: DC

## 2016-12-04 NOTE — Progress Notes (Signed)
Patient: Crystal Higgins Female    DOB: [birth year]   56 y.o.   MRN: [id number] Visit Date: 12/04/2016  Today's Provider: Lelon Huh, MD   Chief Complaint  Patient presents with  . Sore Throat   Subjective:    Sore Throat   This is a new problem. Episode onset: 1 week ago. The problem has been gradually worsening. The pain is worse on the left side. There has been no fever. Associated symptoms include swollen glands and trouble swallowing. Pertinent negatives include no abdominal pain, congestion, coughing, diarrhea, drooling, ear discharge, ear pain, headaches, hoarse voice, plugged ear sensation, neck pain, shortness of breath, stridor or vomiting. She has had no exposure to strep or mono. She has tried gargles and acetaminophen for the symptoms. The treatment provided mild relief.       Allergies  Allergen Reactions  . Codeine Nausea And Vomiting  . Doxycycline Nausea Only  . Penicillins Other (See Comments)    Heart failure @ 48 months old  . Levaquin [Levofloxacin] Itching and Other (See Comments)    Flushing, Burning     Current Outpatient Prescriptions:  .  butalbital-acetaminophen-caffeine (FIORICET) 50-325-40 MG tablet, Take 1-2 tablets by mouth every 6 (six) hours as needed for headache., Disp: 30 tablet, Rfl: 3 .  cholecalciferol (VITAMIN D) 1000 UNITS tablet, Take by mouth., Disp: , Rfl:  .  COD LIVER OIL PO, Take by mouth., Disp: , Rfl:  .  docusate sodium (COLACE) 100 MG capsule, Take 100 mg by mouth daily., Disp: , Rfl:  .  Multiple Vitamin (MULTIVITAMIN WITH MINERALS) TABS tablet, Take 1 tablet by mouth daily., Disp: , Rfl:  .  omeprazole (PRILOSEC) 20 MG capsule, TAKE 1 CAPSULE BY MOUTH EVERY DAY, Disp: 90 capsule, Rfl: 4 .  polyethylene glycol powder (MIRALAX) powder, Take 1 Container by mouth once., Disp: , Rfl:  .  topiramate (TOPAMAX) 25 MG tablet, Take 1 tab qhs for 7 days, then increase to 2 tabs qhs, Disp: 60 tablet, Rfl: 1 .  vitamin E 400 UNIT  capsule, Take 400 Units by mouth every other day., Disp: , Rfl:  .  nortriptyline (PAMELOR) 10 MG capsule, Take 1-2 capsules (10-20 mg total) by mouth at bedtime. (Patient not taking: Reported on 12/04/2016), Disp: 30 capsule, Rfl: 3  Review of Systems  Constitutional: Negative for chills, diaphoresis, fatigue and fever.  HENT: Positive for sore throat and trouble swallowing. Negative for congestion, drooling, ear discharge, ear pain, hearing loss, hoarse voice, mouth sores, nosebleeds, postnasal drip, rhinorrhea, sinus pain, sinus pressure and tinnitus.   Respiratory: Negative for cough, shortness of breath and stridor.   Gastrointestinal: Negative for abdominal pain, diarrhea and vomiting.  Musculoskeletal: Negative for neck pain.  Neurological: Negative for headaches.    Social History  Substance Use Topics  . Smoking status: Never Smoker  . Smokeless tobacco: Never Used  . Alcohol use No   Objective:   BP 138/80 (BP Location: Left Arm, Patient Position: Sitting, Cuff Size: Normal)   Pulse 79   Temp 98.2 F (36.8 C) (Oral)   Resp 16   Wt 131 lb (59.4 kg)   SpO2 99% Comment: room air  BMI 23.21 kg/m   Physical Exam  General Appearance:    Alert, cooperative, no distress  HENT:   bilateral TM normal without fluid or infection, neck without nodes, neck has bilateral anterior cervical nodes enlarged, pharynx erythematous without exudate and sinuses nontender  Eyes:  PERRL, conjunctiva/corneas clear, EOM's intact       Lungs:     Clear to auscultation bilaterally, respirations unlabored  Heart:    Regular rate and rhythm  Neurologic:   Awake, alert, oriented x 3. No apparent focal neurological           defect.       Results for orders placed or performed in visit on 12/04/16  POCT rapid strep A  Result Value Ref Range   Rapid Strep A Screen Negative Negative        Assessment & Plan:     1. Sore throat  - POCT rapid strep A - azithromycin (ZITHROMAX) 250 MG tablet;  2 by mouth today, then 1 daily for 4 days  Dispense: 6 tablet; Refill: 0       Lelon Huh, MD  [facility]

## 2016-12-18 ENCOUNTER — Other Ambulatory Visit: Payer: Self-pay | Admitting: Family Medicine

## 2016-12-18 NOTE — Telephone Encounter (Signed)
Please call in Fioricet.  

## 2016-12-21 NOTE — Telephone Encounter (Signed)
Dr. Caryn Section, how many refills would you like the patient to have? For some reason it says "not specified". Thanks!

## 2016-12-21 NOTE — Telephone Encounter (Signed)
Please call in Fioricet.  

## 2016-12-21 NOTE — Telephone Encounter (Signed)
Called in medication as below.  

## 2016-12-24 ENCOUNTER — Ambulatory Visit
Admission: RE | Admit: 2016-12-24 | Discharge: 2016-12-24 | Disposition: A | Discharge status: Home / Self Care | Payer: BLUE CROSS/BLUE SHIELD | Source: Ambulatory Visit | Attending: Obstetrics and Gynecology | Admitting: Obstetrics and Gynecology

## 2016-12-24 DIAGNOSIS — Z1231 Encounter for screening mammogram for malignant neoplasm of breast: Secondary | ICD-10-CM | POA: Insufficient documentation

## 2017-01-18 ENCOUNTER — Ambulatory Visit (INDEPENDENT_AMBULATORY_CARE_PROVIDER_SITE_OTHER): Payer: BLUE CROSS/BLUE SHIELD | Admitting: Family Medicine

## 2017-01-18 ENCOUNTER — Encounter: Payer: Self-pay | Admitting: Family Medicine

## 2017-01-18 VITALS — BP 118/82 | HR 67 | Temp 97.9°F | Resp 16 | Wt 134.0 lb

## 2017-01-18 DIAGNOSIS — J029 Acute pharyngitis, unspecified: Secondary | ICD-10-CM | POA: Diagnosis not present

## 2017-01-18 MED ORDER — AZITHROMYCIN 250 MG PO TABS: ORAL_TABLET | ORAL | 0 refills | Status: AC

## 2017-01-18 NOTE — Progress Notes (Signed)
Patient: Crystal Higgins Female    DOB: 11-25-1960   56 y.o.   MRN: [id number] Visit Date: 01/18/2017  Today's Provider: Lelon Huh, MD   Chief Complaint  Patient presents with  . Sore Throat    x 1 week   Subjective:    Sore Throat   This is a recurrent problem. Episode onset: 10 days ago. The problem has been waxing and waning. Sore throat worse side: symetric. There has been no fever. Associated symptoms include a hoarse voice and trouble swallowing. Pertinent negatives include no abdominal pain, congestion, coughing, diarrhea, drooling, ear discharge, ear pain, headaches, plugged ear sensation, neck pain, shortness of breath, stridor, swollen glands or vomiting. She has had no exposure to strep or mono. She has tried gargles (also Claritin) for the symptoms. The treatment provided mild relief.       Allergies  Allergen Reactions  . Codeine Nausea And Vomiting  . Doxycycline Nausea Only  . Penicillins Other (See Comments)    Heart failure @ 42 months old  . Levaquin [Levofloxacin] Itching and Other (See Comments)    Flushing, Burning     Current Outpatient Prescriptions:  .  butalbital-acetaminophen-caffeine (FIORICET, ESGIC) 50-325-40 MG tablet, TAKE 1 TO 2 TABLETS BY MOUTH EVERY 6 HOURS, Disp: 30 tablet, Rfl: 5 .  cholecalciferol (VITAMIN D) 1000 UNITS tablet, Take by mouth., Disp: , Rfl:  .  COD LIVER OIL PO, Take by mouth., Disp: , Rfl:  .  docusate sodium (COLACE) 100 MG capsule, Take 100 mg by mouth daily., Disp: , Rfl:  .  Multiple Vitamin (MULTIVITAMIN WITH MINERALS) TABS tablet, Take 1 tablet by mouth daily., Disp: , Rfl:  .  nortriptyline (PAMELOR) 10 MG capsule, Take 1-2 capsules (10-20 mg total) by mouth at bedtime., Disp: 30 capsule, Rfl: 3 .  omeprazole (PRILOSEC) 20 MG capsule, TAKE 1 CAPSULE BY MOUTH EVERY DAY, Disp: 90 capsule, Rfl: 4 .  polyethylene glycol powder (MIRALAX) powder, Take 1 Container by mouth once., Disp: , Rfl:  .  topiramate  (TOPAMAX) 25 MG tablet, Take 1 tab qhs for 7 days, then increase to 2 tabs qhs, Disp: 60 tablet, Rfl: 1 .  vitamin E 400 UNIT capsule, Take 400 Units by mouth every other day., Disp: , Rfl:   Review of Systems  Constitutional: Negative for appetite change, chills, fatigue and fever.  HENT: Positive for hoarse voice and trouble swallowing. Negative for congestion, drooling, ear discharge and ear pain.   Respiratory: Negative for cough, chest tightness, shortness of breath and stridor.   Cardiovascular: Negative for chest pain and palpitations.  Gastrointestinal: Negative for abdominal pain, diarrhea, nausea and vomiting.  Musculoskeletal: Negative for neck pain.  Neurological: Negative for dizziness, weakness and headaches.    Social History  Substance Use Topics  . Smoking status: Never Smoker  . Smokeless tobacco: Never Used  . Alcohol use No   Objective:   BP 118/82 (BP Location: Left Arm, Patient Position: Sitting, Cuff Size: Normal)   Pulse 67   Temp 97.9 F (36.6 C) (Oral)   Resp 16   Wt 134 lb (60.8 kg)   SpO2 99% Comment: room air  BMI 23.74 kg/m   Physical Exam  General Appearance:    Alert, cooperative, no distress  HENT:   bilateral TM normal without fluid or infection, left tonsil inflames with exudate, but not enlarged.  neck has left anterior cervical nodes enlarged, pharynx erythematous without exudate and sinuses nontender  Eyes:    PERRL, conjunctiva/corneas clear, EOM's intact       Lungs:     Clear to auscultation bilaterally, respirations unlabored  Heart:    Regular rate and rhythm  Neurologic:   Awake, alert, oriented x 3. No apparent focal neurological           defect.           Assessment & Plan:     1. Sore throat  - azithromycin (ZITHROMAX) 250 MG tablet; 2 by mouth today, then 1 daily for 4 days  Dispense: 6 tablet; Refill: 0       Lelon Huh, MD  [facility]

## 2017-01-22 ENCOUNTER — Telehealth: Payer: Self-pay

## 2017-01-22 MED ORDER — SUMATRIPTAN SUCCINATE 50 MG PO TABS: 50.0000 mg | ORAL_TABLET | ORAL | 3 refills | Status: DC | PRN

## 2017-01-22 NOTE — Telephone Encounter (Signed)
Pt was in 01/18/2017 and forgot to mention she needed to start back on Imitrex for migraines.  Please send a refill to [facility].   Contact:  [phone].  Thanks,   -Mickel Baas

## 2017-01-26 ENCOUNTER — Telehealth: Payer: Self-pay

## 2017-01-26 MED ORDER — CEPHALEXIN 500 MG PO CAPS: 500.0000 mg | ORAL_CAPSULE | Freq: Four times a day (QID) | ORAL | 0 refills | Status: AC

## 2017-01-26 NOTE — Telephone Encounter (Signed)
error 

## 2017-01-26 NOTE — Telephone Encounter (Signed)
Pt was seen for sore throat 01/18/2017 and was treated with Z Pak. Sx improved, but are now worsening. Is requesting Keflex or other abx. [facility]. Renaldo Fiddler, CMA

## 2017-03-03 DIAGNOSIS — M7501 Adhesive capsulitis of right shoulder: Secondary | ICD-10-CM | POA: Diagnosis not present

## 2017-04-19 ENCOUNTER — Other Ambulatory Visit: Payer: Self-pay | Admitting: Family Medicine

## 2017-07-13 ENCOUNTER — Encounter: Payer: Self-pay | Admitting: Family Medicine

## 2017-07-13 ENCOUNTER — Ambulatory Visit (INDEPENDENT_AMBULATORY_CARE_PROVIDER_SITE_OTHER): Payer: Self-pay | Admitting: Family Medicine

## 2017-07-13 VITALS — BP 120/88 | HR 64 | Temp 97.9°F | Resp 16 | Wt 129.0 lb

## 2017-07-13 DIAGNOSIS — R42 Dizziness and giddiness: Secondary | ICD-10-CM

## 2017-07-13 DIAGNOSIS — H60502 Unspecified acute noninfective otitis externa, left ear: Secondary | ICD-10-CM

## 2017-07-13 MED ORDER — CEPHALEXIN 500 MG PO CAPS: 500.0000 mg | ORAL_CAPSULE | Freq: Four times a day (QID) | ORAL | 0 refills | Status: DC

## 2017-07-13 NOTE — Patient Instructions (Signed)
   You can take OTC meclizine for dizziness

## 2017-07-13 NOTE — Progress Notes (Signed)
Patient: Crystal Higgins Female    DOB: 1961/06/29   56 y.o.   MRN: [id number] Visit Date: 07/13/2017  Today's Provider: Lelon Huh, MD   Chief Complaint  Patient presents with  . Otalgia   Subjective:    Patient has had left ear pain for 2 days. Also has pain in left jaw and left side of her head. No fever, cough or any other symptoms. Patient states that she has also been having dizziness for 2 weeks, mostly when she's walking. Patient stated that in ear and jaw seems to move around. Patient has been taking tylenol and ibuprofen.    Otalgia   There is pain in the left ear. This is a new problem. The current episode started in the past 7 days (2 days). The problem occurs constantly. The problem has been unchanged. There has been no fever. The pain is at a severity of 5/10. The pain is moderate. Pertinent negatives include no abdominal pain, coughing, diarrhea, ear discharge, headaches, rash, rhinorrhea, sore throat or vomiting. She has tried acetaminophen and NSAIDs (tylenol and ibuprofen) for the symptoms. The treatment provided mild relief.       Allergies  Allergen Reactions  . Codeine Nausea And Vomiting  . Doxycycline Nausea Only  . Penicillins Other (See Comments)    Heart failure @ 80 months old  . Levaquin [Levofloxacin] Itching and Other (See Comments)    Flushing, Burning     Current Outpatient Prescriptions:  .  butalbital-acetaminophen-caffeine (FIORICET, ESGIC) 50-325-40 MG tablet, TAKE 1 TO 2 TABLETS BY MOUTH EVERY 6 HOURS, Disp: 30 tablet, Rfl: 5 .  cholecalciferol (VITAMIN D) 1000 UNITS tablet, Take by mouth., Disp: , Rfl:  .  docusate sodium (COLACE) 100 MG capsule, Take 100 mg by mouth daily., Disp: , Rfl:  .  Multiple Vitamin (MULTIVITAMIN WITH MINERALS) TABS tablet, Take 1 tablet by mouth daily., Disp: , Rfl:  .  omeprazole (PRILOSEC) 20 MG capsule, TAKE 1 CAPSULE BY MOUTH DAILY, Disp: 90 capsule, Rfl: 4 .  polyethylene glycol powder (MIRALAX)  powder, Take 1 Container by mouth once., Disp: , Rfl:  .  SUMAtriptan (IMITREX) 50 MG tablet, Take 1 tablet (50 mg total) by mouth every 2 (two) hours as needed for migraine. May repeat in 2 hours if headache persists or recurs., Disp: 8 tablet, Rfl: 3  Review of Systems  Constitutional: Negative for appetite change, chills, fatigue and fever.  HENT: Positive for ear pain. Negative for ear discharge, rhinorrhea and sore throat.   Respiratory: Negative for cough, chest tightness and shortness of breath.   Cardiovascular: Negative for chest pain and palpitations.  Gastrointestinal: Negative for abdominal pain, diarrhea, nausea and vomiting.  Skin: Negative for rash.  Neurological: Negative for dizziness, weakness and headaches.    Social History  Substance Use Topics  . Smoking status: Never Smoker  . Smokeless tobacco: Never Used  . Alcohol use No   Objective:   BP 120/88 (BP Location: Left Arm, Patient Position: Sitting, Cuff Size: Normal)   Pulse 64   Temp 97.9 F (36.6 C) (Oral)   Resp 16   Wt 129 lb (58.5 kg)   SpO2 99%   BMI 22.85 kg/m  Vitals:   07/13/17 1045  BP: 120/88  Pulse: 64  Resp: 16  Temp: 97.9 F (36.6 C)  TempSrc: Oral  SpO2: 99%  Weight: 129 lb (58.5 kg)     Physical Exam   General Appearance:  Alert, cooperative, no distress  HENT:   right TM normal without fluid or infection, left TM red, dull, bulging, left TM fluid noted, neck has left anterior cervical nodes enlarged and sinuses nontender  Eyes:    PERRL, conjunctiva/corneas clear, EOM's intact       Lungs:     Clear to auscultation bilaterally, respirations unlabored  Heart:    Regular rate and rhythm  Neurologic:   Awake, alert, oriented x 3. No apparent focal neurological           defect.           Assessment & Plan:     1. Acute otitis externa of left ear, unspecified type  - cephALEXin (KEFLEX) 500 MG capsule; Take 1 capsule (500 mg total) by mouth 4 (four) times daily.   Dispense: 40 capsule; Refill: 0 Call if symptoms change or if not rapidly improving.     2. Dizziness Likely secondary to otitis. Can try OTC meclizine, call if not effective.      The entirety of the information documented in the History of Present Illness, Review of Systems and Physical Exam were personally obtained by me. Portions of this information were initially documented by April M. Sabra Heck, CMA and reviewed by me for thoroughness and accuracy.   Lelon Huh, MD  [facility]

## 2017-07-19 ENCOUNTER — Telehealth: Payer: Self-pay | Admitting: Family Medicine

## 2017-07-19 MED ORDER — AZITHROMYCIN 250 MG PO TABS: ORAL_TABLET | ORAL | 0 refills | Status: AC

## 2017-07-19 NOTE — Telephone Encounter (Signed)
Advised patient as below.  

## 2017-07-19 NOTE — Telephone Encounter (Signed)
Patient is not too much better, ears are hurting and it feels like left ear is gonna bust.      Uses [facility]

## 2017-07-19 NOTE — Telephone Encounter (Signed)
Change to zpack. Have sent prescription to [facility].

## 2017-08-19 ENCOUNTER — Encounter: Payer: BLUE CROSS/BLUE SHIELD | Admitting: Family Medicine

## 2017-11-19 ENCOUNTER — Encounter: Payer: Self-pay | Admitting: *Deleted

## 2017-11-29 ENCOUNTER — Ambulatory Visit: Payer: Self-pay | Admitting: Family Medicine

## 2017-11-29 ENCOUNTER — Encounter: Payer: Self-pay | Admitting: Family Medicine

## 2017-11-29 VITALS — BP 126/74 | HR 74 | Temp 98.1°F | Resp 16 | Ht 63.0 in | Wt 127.0 lb

## 2017-11-29 DIAGNOSIS — J329 Chronic sinusitis, unspecified: Secondary | ICD-10-CM

## 2017-11-29 MED ORDER — AZITHROMYCIN 250 MG PO TABS: ORAL_TABLET | ORAL | 0 refills | Status: AC

## 2017-11-29 NOTE — Progress Notes (Signed)
Patient: Crystal Higgins Female    DOB: 1961-01-21   57 y.o.   MRN: [id number] Visit Date: 11/29/2017  Today's Provider: Lelon Huh, MD   Chief Complaint  Patient presents with  . Ear Pain   Subjective:    Otalgia   There is pain in the left ear. This is a new problem. The current episode started in the past 7 days. The problem occurs constantly. The problem has been gradually worsening. There has been no fever. The pain is mild. Associated symptoms include coughing, headaches, rhinorrhea and a sore throat. She has tried acetaminophen and NSAIDs for the symptoms. The treatment provided mild relief.   Patient reports that she has been taking Delsym, tylenol, and benadryl with minimal relief.     Allergies  Allergen Reactions  . Codeine Nausea And Vomiting  . Doxycycline Nausea Only  . Penicillins Other (See Comments)    Heart failure @ 61 months old  . Levaquin [Levofloxacin] Itching and Other (See Comments)    Flushing, Burning     Current Outpatient Medications:  .  butalbital-acetaminophen-caffeine (FIORICET, ESGIC) 50-325-40 MG tablet, TAKE 1 TO 2 TABLETS BY MOUTH EVERY 6 HOURS, Disp: 30 tablet, Rfl: 5 .  cholecalciferol (VITAMIN D) 1000 UNITS tablet, Take by mouth., Disp: , Rfl:  .  docusate sodium (COLACE) 100 MG capsule, Take 100 mg by mouth daily., Disp: , Rfl:  .  Multiple Vitamin (MULTIVITAMIN WITH MINERALS) TABS tablet, Take 1 tablet by mouth daily., Disp: , Rfl:  .  omeprazole (PRILOSEC) 20 MG capsule, TAKE 1 CAPSULE BY MOUTH DAILY, Disp: 90 capsule, Rfl: 4 .  polyethylene glycol powder (MIRALAX) powder, Take 1 Container by mouth once., Disp: , Rfl:  .  SUMAtriptan (IMITREX) 50 MG tablet, Take 1 tablet (50 mg total) by mouth every 2 (two) hours as needed for migraine. May repeat in 2 hours if headache persists or recurs., Disp: 8 tablet, Rfl: 3  Review of Systems  HENT: Positive for ear pain, rhinorrhea and sore throat.   Respiratory: Positive for cough.     Neurological: Positive for headaches.    Social History   Tobacco Use  . Smoking status: Never Smoker  . Smokeless tobacco: Never Used  Substance Use Topics  . Alcohol use: No   Objective:   BP 126/74 (BP Location: Right Arm, Patient Position: Sitting, Cuff Size: Normal)   Pulse 74   Temp 98.1 F (36.7 C)   Resp 16   Ht 5\' 3"  (1.6 m)   Wt 127 lb (57.6 kg)   SpO2 99%   BMI 22.50 kg/m  Vitals:   11/29/17 1051  BP: 126/74  Pulse: 74  Resp: 16  Temp: 98.1 F (36.7 C)  SpO2: 99%  Weight: 127 lb (57.6 kg)  Height: 5\' 3"  (1.6 m)     Physical Exam  General Appearance:    Alert, cooperative, no distress  HENT:   right TM normal without fluid or infection, left TM fluid noted, neck without nodes, throat normal without erythema or exudate, frontal and maxillary  sinuses tender and nasal mucosa congested  Eyes:    PERRL, conjunctiva/corneas clear, EOM's intact       Lungs:     Clear to auscultation bilaterally, respirations unlabored  Heart:    Regular rate and rhythm  Neurologic:   Awake, alert, oriented x 3. No apparent focal neurological           defect.  Assessment & Plan:     1. Other sinusitis, unspecified chronicity  - azithromycin (ZITHROMAX) 250 MG tablet; 2 by mouth today, then 1 daily for 4 days  Dispense: 6 tablet; Refill: 0  Call if symptoms change or if not rapidly improving.   Recommended flu vaccine which she refused .      Lelon Huh, MD  [facility]

## 2017-12-06 ENCOUNTER — Telehealth: Payer: Self-pay | Admitting: Family Medicine

## 2017-12-06 NOTE — Telephone Encounter (Signed)
She can take OTC meclizine for the dizziness, and fluticasone nasal spray to relieve congestion in her ears.

## 2017-12-06 NOTE — Telephone Encounter (Signed)
Advised patient as below.  

## 2017-12-06 NOTE — Telephone Encounter (Signed)
Please advise 

## 2017-12-06 NOTE — Telephone Encounter (Signed)
Pt stated that she saw Dr. Caryn Section on 11/29/17 and was Dx with an ear infection. Pt stated that she took azithromycin (ZITHROMAX) 250 MG tablet as directed and completed it on 12/03/17. Pt stated that she is actually dizzier now than she was on 11/29/17. Pt stated that her feels a little better but still has a whooshing sound in her ear. Pt stated that Dr. Caryn Section advised her she could take something for the dizziness but forgot what it was called. Pt is requesting a call back to discuss what she should try for her ear and the dizziness. [facility]. Please advise. Thanks TNP

## 2017-12-29 ENCOUNTER — Encounter: Payer: Self-pay | Admitting: Family Medicine

## 2017-12-29 ENCOUNTER — Ambulatory Visit (INDEPENDENT_AMBULATORY_CARE_PROVIDER_SITE_OTHER): Payer: Self-pay | Admitting: Family Medicine

## 2017-12-29 VITALS — BP 120/84 | HR 64 | Temp 98.1°F | Resp 16 | Wt 128.0 lb

## 2017-12-29 DIAGNOSIS — H66005 Acute suppurative otitis media without spontaneous rupture of ear drum, recurrent, left ear: Secondary | ICD-10-CM

## 2017-12-29 MED ORDER — CEPHALEXIN 500 MG PO CAPS: 500.0000 mg | ORAL_CAPSULE | Freq: Four times a day (QID) | ORAL | 0 refills | Status: AC

## 2017-12-29 NOTE — Progress Notes (Signed)
Patient: Crystal Higgins Female    DOB: 1961/04/21   57 y.o.   MRN: [id number] Visit Date: 12/29/2017  Today's Provider: Lelon Huh, MD   Chief Complaint  Patient presents with  . Otalgia    left   Subjective:    Patient has had left ear pain for 2 days. Patient states she has no other symptoms. Her left ear feels congested, which makes her feel dizzy. Patient has been taking otc sudafed DM with no relief.    Otalgia   There is pain in the left ear. This is a new problem. The current episode started in the past 7 days (2 days). The problem occurs constantly. There has been no fever. Associated symptoms include headaches and hearing loss. Pertinent negatives include no abdominal pain, coughing, diarrhea, ear discharge, neck pain, rash, rhinorrhea, sore throat or vomiting. Treatments tried: sudafed DM. The treatment provided no relief. Her past medical history is significant for a chronic ear infection.       Allergies  Allergen Reactions  . Codeine Nausea And Vomiting  . Doxycycline Nausea Only  . Penicillins Other (See Comments)    Heart failure @ 73 months old  . Levaquin [Levofloxacin] Itching and Other (See Comments)    Flushing, Burning     Current Outpatient Medications:  .  butalbital-acetaminophen-caffeine (FIORICET, ESGIC) 50-325-40 MG tablet, TAKE 1 TO 2 TABLETS BY MOUTH EVERY 6 HOURS, Disp: 30 tablet, Rfl: 5 .  cholecalciferol (VITAMIN D) 1000 UNITS tablet, Take by mouth., Disp: , Rfl:  .  docusate sodium (COLACE) 100 MG capsule, Take 100 mg by mouth daily., Disp: , Rfl:  .  Multiple Vitamin (MULTIVITAMIN WITH MINERALS) TABS tablet, Take 1 tablet by mouth daily., Disp: , Rfl:  .  omeprazole (PRILOSEC) 20 MG capsule, TAKE 1 CAPSULE BY MOUTH DAILY, Disp: 90 capsule, Rfl: 4 .  polyethylene glycol powder (MIRALAX) powder, Take 1 Container by mouth once., Disp: , Rfl:  .  SUMAtriptan (IMITREX) 50 MG tablet, Take 1 tablet (50 mg total) by mouth every 2 (two) hours  as needed for migraine. May repeat in 2 hours if headache persists or recurs., Disp: 8 tablet, Rfl: 3  Review of Systems  Constitutional: Negative for appetite change, chills, fatigue and fever.  HENT: Positive for ear pain and hearing loss. Negative for ear discharge, rhinorrhea and sore throat.   Respiratory: Negative for cough, chest tightness and shortness of breath.   Cardiovascular: Negative for chest pain and palpitations.  Gastrointestinal: Negative for abdominal pain, diarrhea, nausea and vomiting.  Musculoskeletal: Negative for neck pain.  Skin: Negative for rash.  Neurological: Positive for headaches. Negative for dizziness and weakness.    Social History   Tobacco Use  . Smoking status: Never Smoker  . Smokeless tobacco: Never Used  Substance Use Topics  . Alcohol use: No   Objective:   BP 120/84 (BP Location: Right Arm, Patient Position: Sitting, Cuff Size: Normal)   Pulse 64   Temp 98.1 F (36.7 C) (Oral)   Resp 16   Wt 128 lb (58.1 kg)   SpO2 99%   BMI 22.67 kg/m  Vitals:   12/29/17 1637  BP: 120/84  Pulse: 64  Resp: 16  Temp: 98.1 F (36.7 C)  TempSrc: Oral  SpO2: 99%  Weight: 128 lb (58.1 kg)     Physical Exam  General Appearance:    Alert, cooperative, no distress  HENT:   right TM normal without fluid or  infection, left TM red, dull, bulging, left TM fluid noted, neck has left anterior cervical nodes enlarged and sinuses nontender  Eyes:    PERRL, conjunctiva/corneas clear, EOM's intact       Lungs:     Clear to auscultation bilaterally, respirations unlabored      Assessment & Plan:     1. Recurrent acute suppurative otitis media without spontaneous rupture of left tympanic membrane  - cephALEXin (KEFLEX) 500 MG capsule; Take 1 capsule (500 mg total) by mouth 4 (four) times daily for 10 days.  Dispense: 40 capsule; Refill: 0       Lelon Huh, MD  [facility]

## 2018-01-03 ENCOUNTER — Ambulatory Visit
Admission: RE | Admit: 2018-01-03 | Discharge: 2018-01-03 | Disposition: A | Discharge status: Home / Self Care | Payer: Self-pay | Source: Ambulatory Visit | Attending: Oncology | Admitting: Oncology

## 2018-01-03 ENCOUNTER — Ambulatory Visit: Payer: Self-pay | Attending: Oncology | Admitting: *Deleted

## 2018-01-03 VITALS — BP 124/82 | HR 66 | Temp 97.2°F | Ht 64.0 in | Wt 127.0 lb

## 2018-01-03 DIAGNOSIS — Z0000 Encounter for general adult medical examination without abnormal findings: Secondary | ICD-10-CM

## 2018-01-03 NOTE — Progress Notes (Signed)
Subjective:     Patient ID: Crystal Higgins, female   DOB: [birth year], 57 y.o.   MRN: [id number]  HPI   Review of Systems     Objective:   Physical Exam  Pulmonary/Chest: Right breast exhibits tenderness. Right breast exhibits no inverted nipple, no mass, no nipple discharge and no skin change. Left breast exhibits tenderness. Left breast exhibits no inverted nipple, no mass, no nipple discharge and no skin change. Breasts are symmetrical.         Assessment:     57 year old White female presents to [facility] for clinical breast exam and mammogram only.  Clinical breast exam very difficult due to extreme tenderness on palpation.  She is even tearful on exam.  States she has been like this since the birth of her son 57 years ago.  States she tried cutting back on caffeine without any relief.  She states she has shoulder problems and cannot raise the right arm above 90 degrees.  Taught self breast awareness.  Last pap on 10/20/16 was negative / negative.  Next pap due in 2022.  Patient has been screened for eligibility.  She does not have any insurance, Medicare or Medicaid.  She also meets financial eligibility.  Hand-out given on the Affordable Care Act.    Plan:     Screening mammogram ordered.

## 2018-01-06 ENCOUNTER — Encounter: Payer: Self-pay | Admitting: *Deleted

## 2018-01-06 NOTE — Progress Notes (Signed)
Letter mailed from the [facility] to inform patient of her normal mammogram results.  Patient is to follow-up with annual screening in one year.  HSIS to Christy. 

## 2018-01-26 ENCOUNTER — Ambulatory Visit (INDEPENDENT_AMBULATORY_CARE_PROVIDER_SITE_OTHER): Payer: Self-pay | Admitting: Family Medicine

## 2018-01-26 ENCOUNTER — Encounter: Payer: Self-pay | Admitting: Family Medicine

## 2018-01-26 VITALS — BP 122/84 | HR 64 | Temp 98.3°F | Resp 16 | Wt 131.0 lb

## 2018-01-26 DIAGNOSIS — J029 Acute pharyngitis, unspecified: Secondary | ICD-10-CM

## 2018-01-26 MED ORDER — AZITHROMYCIN 250 MG PO TABS: ORAL_TABLET | ORAL | 0 refills | Status: AC

## 2018-01-26 NOTE — Progress Notes (Signed)
Patient: Crystal Higgins Female    DOB: 1961-02-26   57 y.o.   MRN: [id number] Visit Date: 01/26/2018  Today's Provider: Lelon Huh, MD   Chief Complaint  Patient presents with  . Sore Throat    x 5 days    Subjective:    Sore Throat   This is a new problem. Episode onset: 5 days ago. The problem has been gradually worsening. Neither side of throat is experiencing more pain than the other. There has been no fever. Associated symptoms include ear pain (left ear), headaches, a plugged ear sensation (left ear feels full) and trouble swallowing (hurts to swallow). Pertinent negatives include no coughing, ear discharge or shortness of breath. She has had no exposure to strep or mono. She has tried NSAIDs and gargles for the symptoms. The treatment provided mild relief.       Allergies  Allergen Reactions  . Codeine Nausea And Vomiting  . Doxycycline Nausea Only  . Penicillins Other (See Comments)    Heart failure @ 61 months old  . Levaquin [Levofloxacin] Itching and Other (See Comments)    Flushing, Burning     Current Outpatient Medications:  .  butalbital-acetaminophen-caffeine (FIORICET, ESGIC) 50-325-40 MG tablet, TAKE 1 TO 2 TABLETS BY MOUTH EVERY 6 HOURS, Disp: 30 tablet, Rfl: 5 .  cholecalciferol (VITAMIN D) 1000 UNITS tablet, Take by mouth., Disp: , Rfl:  .  docusate sodium (COLACE) 100 MG capsule, Take 100 mg by mouth daily., Disp: , Rfl:  .  Multiple Vitamin (MULTIVITAMIN WITH MINERALS) TABS tablet, Take 1 tablet by mouth daily., Disp: , Rfl:  .  omeprazole (PRILOSEC) 20 MG capsule, TAKE 1 CAPSULE BY MOUTH DAILY, Disp: 90 capsule, Rfl: 4 .  polyethylene glycol powder (MIRALAX) powder, Take 1 Container by mouth once., Disp: , Rfl:  .  SUMAtriptan (IMITREX) 50 MG tablet, Take 1 tablet (50 mg total) by mouth every 2 (two) hours as needed for migraine. May repeat in 2 hours if headache persists or recurs., Disp: 8 tablet, Rfl: 3  Review of Systems  Constitutional:  Negative for appetite change, chills, fatigue and fever.  HENT: Positive for ear pain (left ear), sore throat and trouble swallowing (hurts to swallow). Negative for ear discharge.   Respiratory: Negative for cough, chest tightness and shortness of breath.   Cardiovascular: Negative for chest pain and palpitations.  Gastrointestinal: Negative for nausea.  Neurological: Positive for headaches. Negative for dizziness and weakness.    Social History   Tobacco Use  . Smoking status: Never Smoker  . Smokeless tobacco: Never Used  Substance Use Topics  . Alcohol use: No   Objective:   BP 122/84 (BP Location: Left Arm, Patient Position: Sitting, Cuff Size: Normal)   Pulse 64   Temp 98.3 F (36.8 C) (Oral)   Resp 16   Wt 131 lb (59.4 kg)   SpO2 98% Comment: room air  BMI 22.49 kg/m  There were no vitals filed for this visit.   Physical Exam  General Appearance:    Alert, cooperative, no distress  HENT:   bilateral TM normal without fluid or infection, neck without nodes and pharynx erythematous without exudate  Eyes:    PERRL, conjunctiva/corneas clear, EOM's intact       Lungs:     Clear to auscultation bilaterally, respirations unlabored  Heart:    Regular rate and rhythm  Neurologic:   Awake, alert, oriented x 3. No apparent focal neurological  defect.            Assessment & Plan:     1. Pharyngitis, unspecified etiology  - azithromycin (ZITHROMAX) 250 MG tablet; 2 by mouth today, then 1 daily for 4 days  Dispense: 6 tablet; Refill: 0       Lelon Huh, MD  [facility]

## 2018-02-03 ENCOUNTER — Other Ambulatory Visit: Payer: Self-pay | Admitting: Family Medicine

## 2018-03-07 ENCOUNTER — Ambulatory Visit: Payer: Self-pay | Admitting: Family Medicine

## 2018-03-07 ENCOUNTER — Encounter: Payer: Self-pay | Admitting: Family Medicine

## 2018-03-07 VITALS — BP 120/84 | HR 72 | Temp 97.7°F | Resp 16 | Wt 127.0 lb

## 2018-03-07 DIAGNOSIS — H66005 Acute suppurative otitis media without spontaneous rupture of ear drum, recurrent, left ear: Secondary | ICD-10-CM

## 2018-03-07 MED ORDER — AZITHROMYCIN 250 MG PO TABS: ORAL_TABLET | ORAL | 0 refills | Status: AC

## 2018-03-07 NOTE — Progress Notes (Signed)
Patient: Crystal Higgins Female    DOB: 11-29-1960   57 y.o.   MRN: [id number] Visit Date: 03/07/2018  Today's Provider: Lelon Huh, MD   Chief Complaint  Patient presents with  . Ear Pain    x 2 weeks   Subjective:    Otalgia   There is pain in the left ear. This is a recurrent problem. Episode onset: 2 weeks ago. The problem has been waxing and waning. There has been no fever. The pain is moderate. Associated symptoms include ear discharge. Pertinent negatives include no abdominal pain, coughing, diarrhea, headaches, hearing loss, neck pain, rash, rhinorrhea, sore throat (scratchy theoat started today) or vomiting. She has tried acetaminophen (also tried flonase and Sudafed) for the symptoms. The treatment provided mild relief.  Patient describes the ear pain as a pressure sensation behind her ear.      Allergies  Allergen Reactions  . Codeine Nausea And Vomiting  . Doxycycline Nausea Only  . Penicillins Other (See Comments)    Heart failure @ 22 months old  . Levaquin [Levofloxacin] Itching and Other (See Comments)    Flushing, Burning     Current Outpatient Medications:  .  butalbital-acetaminophen-caffeine (FIORICET, ESGIC) 50-325-40 MG tablet, TAKE 1-2 TABLETS BY MOUTH EVERY 6 HOURS AS NEEDED, Disp: 30 tablet, Rfl: 5 .  cholecalciferol (VITAMIN D) 1000 UNITS tablet, Take by mouth., Disp: , Rfl:  .  docusate sodium (COLACE) 100 MG capsule, Take 100 mg by mouth daily., Disp: , Rfl:  .  Multiple Vitamin (MULTIVITAMIN WITH MINERALS) TABS tablet, Take 1 tablet by mouth daily., Disp: , Rfl:  .  omeprazole (PRILOSEC) 20 MG capsule, TAKE 1 CAPSULE BY MOUTH DAILY, Disp: 90 capsule, Rfl: 4 .  polyethylene glycol powder (MIRALAX) powder, Take 1 Container by mouth once., Disp: , Rfl:  .  SUMAtriptan (IMITREX) 50 MG tablet, Take 1 tablet (50 mg total) by mouth every 2 (two) hours as needed for migraine. May repeat in 2 hours if headache persists or recurs., Disp: 8 tablet,  Rfl: 3  Review of Systems  Constitutional: Negative for appetite change, chills, fatigue and fever.  HENT: Positive for ear discharge and ear pain. Negative for hearing loss, rhinorrhea and sore throat (scratchy theoat started today).   Respiratory: Negative for cough, chest tightness and shortness of breath.   Cardiovascular: Negative for chest pain and palpitations.  Gastrointestinal: Negative for abdominal pain, diarrhea, nausea and vomiting.  Musculoskeletal: Negative for neck pain.  Skin: Negative for rash.  Neurological: Negative for dizziness, weakness and headaches.    Social History   Tobacco Use  . Smoking status: Never Smoker  . Smokeless tobacco: Never Used  Substance Use Topics  . Alcohol use: No   Objective:   BP 120/84 (BP Location: Left Arm, Patient Position: Sitting, Cuff Size: Normal)   Pulse 72   Temp 97.7 F (36.5 C) (Oral)   Resp 16   Wt 127 lb (57.6 kg)   SpO2 98% Comment: room air  BMI 21.80 kg/m  There were no vitals filed for this visit.   Physical Exam  General Appearance:    Alert, cooperative, no distress  HENT:   right TM normal without fluid or infection, left TM red, dull, bulging, neck without nodes and sinuses nontender  Eyes:    PERRL, conjunctiva/corneas clear, EOM's intact       Lungs:     Clear to auscultation bilaterally, respirations unlabored  Heart:    Regular  rate and rhythm  Neurologic:   Awake, alert, oriented x 3. No apparent focal neurological           defect.           Assessment & Plan:     1. Recurrent acute suppurative otitis media without spontaneous rupture of left tympanic membrane  - azithromycin (ZITHROMAX) 250 MG tablet; 2 by mouth today, then 1 daily for 4 days  Dispense: 6 tablet; Refill: 0  Call if symptoms change or if not rapidly improving.          Lelon Huh, MD  [facility]

## 2018-03-11 ENCOUNTER — Telehealth: Payer: Self-pay | Admitting: Family Medicine

## 2018-03-11 MED ORDER — SULFAMETHOXAZOLE-TRIMETHOPRIM 800-160 MG PO TABS: 1.0000 | ORAL_TABLET | Freq: Two times a day (BID) | ORAL | 0 refills | Status: AC

## 2018-03-11 NOTE — Telephone Encounter (Signed)
Please advise 

## 2018-03-11 NOTE — Telephone Encounter (Signed)
Have sent prescription for septra DS. If not better when finished she will need to follow up with ENT.

## 2018-03-11 NOTE — Telephone Encounter (Signed)
Pt stated she saw Dr. Caryn Section on 03/07/18 for OV and started taking azithromycin (ZITHROMAX) 250 MG tablet as directed. Pt will finish the medication today with no improvements to her symptoms. Pt is requesting a call back to discuss if she should try something else. [facility]. Please advise. Thanks TNP

## 2018-03-11 NOTE — Telephone Encounter (Addendum)
Patient was notified. Expressed understanding.  

## 2018-05-23 ENCOUNTER — Ambulatory Visit: Payer: 59 | Admitting: Family Medicine

## 2018-05-23 ENCOUNTER — Other Ambulatory Visit: Payer: Self-pay | Admitting: Family Medicine

## 2018-05-23 ENCOUNTER — Encounter: Payer: Self-pay | Admitting: Family Medicine

## 2018-05-23 VITALS — BP 112/76 | HR 77 | Temp 97.8°F | Resp 18 | Wt 127.0 lb

## 2018-05-23 DIAGNOSIS — H66005 Acute suppurative otitis media without spontaneous rupture of ear drum, recurrent, left ear: Secondary | ICD-10-CM

## 2018-05-23 NOTE — Patient Instructions (Addendum)
   Try taking 12 hour Sudafed (pseudoephedrine) once every morning   Call for antibiotic prescription if not much better in 4-5 days

## 2018-05-23 NOTE — Progress Notes (Signed)
Patient: Crystal Higgins Female    DOB: [birth year]   57 y.o.   MRN: [id number] Visit Date: 05/23/2018  Today's Provider: Lelon Huh, MD   Chief Complaint  Patient presents with  . Ear Injury    left ear pain x 6 months    Subjective:    Otalgia   There is pain in the left ear. Episode onset: 6 months ago. The problem has been unchanged. Associated symptoms include headaches (worsened; medications no longer effective). Pertinent negatives include no abdominal pain, rhinorrhea, sore throat or vomiting.  Patent has seen PA with ENT since the onset of ear pain was prescribed Nasacort and prednisone which didn't help.  Had naso pharyngoscopy by Dr. Pryor Ochoa  which she reports was normal. She states they discussed possibly removing her tonsils or putting a hole in her ear drum. .    States she is having headaches every day and Fioricet isn't helping anymore.       Allergies  Allergen Reactions  . Codeine Nausea And Vomiting  . Doxycycline Nausea Only  . Penicillins Other (See Comments)    Heart failure @ 25 months old  . Levaquin [Levofloxacin] Itching and Other (See Comments)    Flushing, Burning     Current Outpatient Medications:  .  butalbital-acetaminophen-caffeine (FIORICET, ESGIC) 50-325-40 MG tablet, TAKE 1-2 TABLETS BY MOUTH EVERY 6 HOURS AS NEEDED, Disp: 30 tablet, Rfl: 5 .  cholecalciferol (VITAMIN D) 1000 UNITS tablet, Take by mouth., Disp: , Rfl:  .  docusate sodium (COLACE) 100 MG capsule, Take 100 mg by mouth daily., Disp: , Rfl:  .  Multiple Vitamin (MULTIVITAMIN WITH MINERALS) TABS tablet, Take 1 tablet by mouth daily., Disp: , Rfl:  .  omeprazole (PRILOSEC) 20 MG capsule, TAKE 1 CAPSULE BY MOUTH EVERY DAY, Disp: 90 capsule, Rfl: 4 .  polyethylene glycol powder (MIRALAX) powder, Take 1 Container by mouth once., Disp: , Rfl:  .  SUMAtriptan (IMITREX) 50 MG tablet, Take 1 tablet (50 mg total) by mouth every 2 (two) hours as needed for migraine. May repeat in  2 hours if headache persists or recurs., Disp: 8 tablet, Rfl: 3  Review of Systems  Constitutional: Negative for appetite change, chills, fatigue and fever.  HENT: Positive for ear pain. Negative for rhinorrhea and sore throat.   Respiratory: Negative for chest tightness and shortness of breath.   Cardiovascular: Negative for chest pain and palpitations.  Gastrointestinal: Negative for abdominal pain, nausea and vomiting.  Neurological: Positive for headaches (worsened; medications no longer effective). Negative for dizziness and weakness.    Social History   Tobacco Use  . Smoking status: Never Smoker  . Smokeless tobacco: Never Used  Substance Use Topics  . Alcohol use: No   Objective:   BP 112/76 (BP Location: Left Arm, Patient Position: Sitting, Cuff Size: Normal)   Pulse 77   Temp 97.8 F (36.6 C) (Oral)   Resp 18   Wt 127 lb (57.6 kg)   SpO2 98% Comment: room air  BMI 21.80 kg/m  Vitals:   05/23/18 1451  BP: 112/76  Pulse: 77  Resp: 18  Temp: 97.8 F (36.6 C)  TempSrc: Oral  SpO2: 98%  Weight: 127 lb (57.6 kg)     Physical Exam  General appearance: alert, well developed, well nourished, cooperative and in no distress Head: Normocephalic, without obvious abnormality, atraumatic Respiratory: Respirations even and unlabored, normal respiratory rate Extremities: No gross deformities Skin: Skin color, texture, turgor  normal. No rashes seen  Psych: Appropriate mood and affect. Neurologic: Mental status: Alert, oriented to person, place, and time, thought content appropriate.     Assessment & Plan:     1. Recurrent acute suppurative otitis media without spontaneous rupture of left tympanic membrane Likely due to eustachian tube dysfunction. Will obtain records from Dr. Pryor Ochoa. In the meantime will try daily dose of 12 hour Sudafed, which she states she has taken in the past and did help with ear pain. Consider another course of antibiotic if not feeling much  better after 4-5 days of Sudafed.        Lelon Huh, MD  [facility]

## 2018-05-27 ENCOUNTER — Telehealth: Payer: Self-pay | Admitting: Family Medicine

## 2018-05-27 MED ORDER — AZITHROMYCIN 250 MG PO TABS: ORAL_TABLET | ORAL | 0 refills | Status: AC

## 2018-05-27 NOTE — Telephone Encounter (Signed)
Patient advised and verbally voiced understanding.  

## 2018-05-27 NOTE — Telephone Encounter (Signed)
Patient states that she was in for a office visit on 05/23/2018 for her ear and you asked her to let you know if it is not better.  She states that she took the sudafed and it made her nose and did not help her ear at all.  She states that her ear is a little more painful than when she was here on Monday.   She states that you discussed antibiotics if it was not doing any better.  She would like to try that if that is okay.  She uses [facility].

## 2018-05-27 NOTE — Telephone Encounter (Signed)
Please review. Thanks!  

## 2018-05-27 NOTE — Telephone Encounter (Signed)
Have sent prescription for azithromycin, keep taking the sudafed.

## 2018-05-28 DIAGNOSIS — Z711 Person with feared health complaint in whom no diagnosis is made: Secondary | ICD-10-CM | POA: Diagnosis not present

## 2018-06-06 DIAGNOSIS — Z1211 Encounter for screening for malignant neoplasm of colon: Secondary | ICD-10-CM | POA: Diagnosis not present

## 2018-06-06 DIAGNOSIS — Z01419 Encounter for gynecological examination (general) (routine) without abnormal findings: Secondary | ICD-10-CM | POA: Diagnosis not present

## 2018-06-13 DIAGNOSIS — Z1211 Encounter for screening for malignant neoplasm of colon: Secondary | ICD-10-CM | POA: Diagnosis not present

## 2018-06-20 NOTE — Progress Notes (Signed)
Patient: Crystal Higgins Female    DOB: 1960/12/15   57 y.o.   MRN: [id number] Visit Date: 06/21/2018  Today's Provider: Lelon Huh, MD   Chief Complaint  Patient presents with  . Ear Drainage   Subjective:    HPI  Patient is here to discuss her left ear pressure and discomfort. She was last seen her 05-23-18 and recommend trial of 12 hour sudafed for eustachian tube dysfunction. She state she only takes it once a week because it makes her mouth dry and makes it harder to fall asleep, but ear has not bothered at all the last few weeks. Patient has been seeing ENT and was recommended to have tympanoplasty, but she would rather not have surgery unless she really has to. Also patient states her throat has been dry and itchy for 2 days.   Allergies  Allergen Reactions  . Codeine Nausea And Vomiting  . Doxycycline Nausea Only  . Penicillins Other (See Comments)    Heart failure @ 24 months old  . Levaquin [Levofloxacin] Itching and Other (See Comments)    Flushing, Burning     Current Outpatient Medications:  .  butalbital-acetaminophen-caffeine (FIORICET, ESGIC) 50-325-40 MG tablet, TAKE 1-2 TABLETS BY MOUTH EVERY 6 HOURS AS NEEDED, Disp: 30 tablet, Rfl: 5 .  cholecalciferol (VITAMIN D) 1000 UNITS tablet, Take by mouth., Disp: , Rfl:  .  docusate sodium (COLACE) 100 MG capsule, Take 100 mg by mouth daily., Disp: , Rfl:  .  Multiple Vitamin (MULTIVITAMIN WITH MINERALS) TABS tablet, Take 1 tablet by mouth daily., Disp: , Rfl:  .  omeprazole (PRILOSEC) 20 MG capsule, TAKE 1 CAPSULE BY MOUTH EVERY DAY, Disp: 90 capsule, Rfl: 4 .  polyethylene glycol powder (MIRALAX) powder, Take 1 Container by mouth once., Disp: , Rfl:  .  SUMAtriptan (IMITREX) 50 MG tablet, Take 1 tablet (50 mg total) by mouth every 2 (two) hours as needed for migraine. May repeat in 2 hours if headache persists or recurs., Disp: 8 tablet, Rfl: 3  Review of Systems  Constitutional: Negative for appetite change,  chills, fatigue and fever.  Respiratory: Negative for chest tightness and shortness of breath.   Cardiovascular: Negative for chest pain and palpitations.  Gastrointestinal: Negative for nausea.  Neurological: Negative for dizziness and weakness.    Social History   Tobacco Use  . Smoking status: Never Smoker  . Smokeless tobacco: Never Used  Substance Use Topics  . Alcohol use: No   Objective:   BP 119/82 (BP Location: Right Arm, Patient Position: Sitting, Cuff Size: Normal)   Pulse 67   Temp 97.7 F (36.5 C) (Oral)   Resp 16   Wt 124 lb (56.2 kg)   SpO2 96%   BMI 21.28 kg/m  Vitals:   06/21/18 1010  BP: 119/82  Pulse: 67  Resp: 16  Temp: 97.7 F (36.5 C)  TempSrc: Oral  SpO2: 96%  Weight: 124 lb (56.2 kg)     Physical Exam  General Appearance:    Alert, cooperative, no distress  HENT:   ENT exam normal, no neck nodes or sinus tenderness. Mildly inflamed tonsillar pillars.   Eyes:    PERRL, conjunctiva/corneas clear, EOM's intact       Lungs:     Clear to auscultation bilaterally, respirations unlabored  Heart:    Regular rate and rhythm  Neurologic:   Awake, alert, oriented x 3. No apparent focal neurological  defect.          Assessment & Plan:     1. Eustachian tube disorder, left Doing better with use of oral decongestants. She can continue to use this a few times a week as tolerated. She is not planning on proceeding with tympanoplasty at this time.   2. Pharyngitis, unspecified etiology Likely viral, advises she can call if not resolving within a week.        Lelon Huh, MD  [facility]

## 2018-06-21 ENCOUNTER — Ambulatory Visit: Payer: 59 | Admitting: Family Medicine

## 2018-06-21 ENCOUNTER — Encounter: Payer: Self-pay | Admitting: Family Medicine

## 2018-06-21 VITALS — BP 119/82 | HR 67 | Temp 97.7°F | Resp 16 | Wt 124.0 lb

## 2018-06-21 DIAGNOSIS — J029 Acute pharyngitis, unspecified: Secondary | ICD-10-CM

## 2018-06-21 DIAGNOSIS — H6992 Unspecified Eustachian tube disorder, left ear: Secondary | ICD-10-CM | POA: Diagnosis not present

## 2018-06-24 ENCOUNTER — Telehealth: Payer: Self-pay

## 2018-06-24 MED ORDER — SULFAMETHOXAZOLE-TRIMETHOPRIM 800-160 MG PO TABS: 1.0000 | ORAL_TABLET | Freq: Two times a day (BID) | ORAL | 0 refills | Status: AC

## 2018-06-24 NOTE — Telephone Encounter (Signed)
Have sent order for bactrim to [facility].

## 2018-06-24 NOTE — Telephone Encounter (Signed)
Patient was seen earlier in the week by Dr Caryn Section with a sore throat.  She states he told her that if she didn't get any better to call back and he would send something in for her.  She states she is not any better and would like something sent in.   Patient also stated that due to some ear infections she has been on ZPak several times and is afraid that being on it so much might keep it from working well.  She asked if Dr Caryn Section thought it was ok to have something other than ZPak.  She has difficulty taking many medication and didn't know if Bactrim would help with this.  If so she would like to try it.   Advised patient that if there were any questions or concerns we would call her back otherwise she could just check with the pharmacy later.    Her call back number is [phone]  Thanks [place]

## 2018-07-16 ENCOUNTER — Encounter: Payer: Self-pay | Admitting: Physician Assistant

## 2018-07-16 ENCOUNTER — Ambulatory Visit: Payer: 59 | Admitting: Physician Assistant

## 2018-07-16 VITALS — BP 106/80 | HR 77 | Temp 97.6°F | Resp 16 | Wt 123.6 lb

## 2018-07-16 DIAGNOSIS — T7840XA Allergy, unspecified, initial encounter: Secondary | ICD-10-CM

## 2018-07-16 DIAGNOSIS — J029 Acute pharyngitis, unspecified: Secondary | ICD-10-CM

## 2018-07-16 MED ORDER — PREDNISONE 10 MG (21) PO TBPK: ORAL_TABLET | ORAL | 0 refills | Status: DC

## 2018-07-16 NOTE — Progress Notes (Signed)
Patient: Crystal Higgins Female    DOB: [birth year]   57 y.o.   MRN: [id number] Visit Date: 07/16/2018  Today's Provider: Mar Daring, PA-C   Chief Complaint  Patient presents with  . Pruritis   Subjective:    HPI   Patient comes in office today with concerns of a possible reaction after being in contact with Penicillin. Patient states that she is highly allergic to drug and husband is currently on drug to treat for abscess. They did engage in sexual intercourse prior to reaction. Patient states that on the 07/11/18 she noticed that she had a "break out" in her mouth, started Monday. Patient reports itching of the gums and bumps around the mouth. Patient states that itching in mouth has cleared but she states that now she has itching on her lower face, scalp and complains of itching under her skin on her arms.   She also complains of a sore throat. No fever. No sinus pain. Some post nasal drainage. Has been around sick contacts. Her two granddauhgters have had sore throats. Both tested negative for strep. She denies fever.    Allergies  Allergen Reactions  . Codeine Nausea And Vomiting  . Doxycycline Nausea Only  . Penicillins Other (See Comments)    Heart failure @ 50 months old  . Levaquin [Levofloxacin] Itching and Other (See Comments)    Flushing, Burning     Current Outpatient Medications:  .  butalbital-acetaminophen-caffeine (FIORICET, ESGIC) 50-325-40 MG tablet, TAKE 1-2 TABLETS BY MOUTH EVERY 6 HOURS AS NEEDED, Disp: 30 tablet, Rfl: 5 .  cholecalciferol (VITAMIN D) 1000 UNITS tablet, Take by mouth., Disp: , Rfl:  .  docusate sodium (COLACE) 100 MG capsule, Take 100 mg by mouth daily., Disp: , Rfl:  .  Multiple Vitamin (MULTIVITAMIN WITH MINERALS) TABS tablet, Take 1 tablet by mouth daily., Disp: , Rfl:  .  omeprazole (PRILOSEC) 20 MG capsule, TAKE 1 CAPSULE BY MOUTH EVERY DAY, Disp: 90 capsule, Rfl: 4 .  polyethylene glycol powder (MIRALAX) powder, Take 1  Container by mouth once., Disp: , Rfl:  .  SUMAtriptan (IMITREX) 50 MG tablet, Take 1 tablet (50 mg total) by mouth every 2 (two) hours as needed for migraine. May repeat in 2 hours if headache persists or recurs., Disp: 8 tablet, Rfl: 3  Review of Systems  Constitutional: Negative.   HENT: Positive for postnasal drip and sore throat.   Eyes: Negative.   Respiratory: Negative.   Cardiovascular: Negative.   Gastrointestinal: Negative.   Endocrine: Negative.   Genitourinary: Negative.   Musculoskeletal: Negative.   Skin: Negative for color change, rash and wound.  Neurological: Negative.   Hematological: Negative.   Psychiatric/Behavioral: Negative.     Social History   Tobacco Use  . Smoking status: Never Smoker  . Smokeless tobacco: Never Used  Substance Use Topics  . Alcohol use: No   Objective:   BP 106/80   Pulse 77   Temp 97.6 F (36.4 C) (Oral)   Resp 16   Wt 123 lb 9.6 oz (56.1 kg)   BMI 21.22 kg/m  Vitals:   07/16/18 0952  BP: 106/80  Pulse: 77  Resp: 16  Temp: 97.6 F (36.4 C)  TempSrc: Oral  Weight: 123 lb 9.6 oz (56.1 kg)     Physical Exam  Constitutional: She appears well-developed and well-nourished. No distress.  HENT:  Head: Normocephalic and atraumatic.  Right Ear: Hearing, tympanic membrane, external ear and ear  canal normal.  Left Ear: Hearing, tympanic membrane, external ear and ear canal normal.  Nose: Nose normal.  Mouth/Throat: Uvula is midline, oropharynx is clear and moist and mucous membranes are normal. No oropharyngeal exudate. Posterior oropharyngeal erythema: some mild cobblestoning appearance.  Eyes: Pupils are equal, round, and reactive to light. Conjunctivae are normal. Right eye exhibits no discharge. Left eye exhibits no discharge. No scleral icterus.  Neck: Normal range of motion. Neck supple. No tracheal deviation present. No thyromegaly present.  Cardiovascular: Normal rate, regular rhythm and normal heart sounds. Exam  reveals no gallop and no friction rub.  No murmur heard. Pulmonary/Chest: Effort normal and breath sounds normal. No stridor. No respiratory distress. She has no wheezes. She has no rales.  Lymphadenopathy:    She has no cervical adenopathy.  Skin: Skin is warm and dry. Rash noted. Rash is papular (fine papular rash on upper extremities). She is not diaphoretic.  Vitals reviewed.       Assessment & Plan:     1. Allergic reaction, initial encounter Will treat with prednisone as below. Possible allergic response to penicillin through seminal contact. Few case studies do confirm this is likely. Will treat with prednisone as below. Call if symptoms worsen.  - predniSONE (STERAPRED UNI-PAK 21 TAB) 10 MG (21) TBPK tablet; 6 day taper; take as directed on package instructions  Dispense: 21 tablet; Refill: 0  2. Sore throat Suspect viral vs irritation from the allergic response. Hope will improve with steroid as noted above. If not, call for re-evaluation.        Mar Daring, PA-C  [facility]

## 2018-07-16 NOTE — Patient Instructions (Signed)

## 2018-07-18 ENCOUNTER — Telehealth: Payer: Self-pay | Admitting: Family Medicine

## 2018-07-18 DIAGNOSIS — J029 Acute pharyngitis, unspecified: Secondary | ICD-10-CM

## 2018-07-18 MED ORDER — AZITHROMYCIN 250 MG PO TABS: ORAL_TABLET | ORAL | 0 refills | Status: DC

## 2018-07-18 NOTE — Telephone Encounter (Signed)
Pt was in Saturday for a rash and sore throat.  She said you told her if her sore throat was not any better to call her back for a rx.  She uses [facility]  CB#  [phone]  Thanks Con Memos

## 2018-07-18 NOTE — Telephone Encounter (Signed)
Sent in zpak

## 2018-07-18 NOTE — Telephone Encounter (Signed)
Patient advised as below.  

## 2018-07-28 ENCOUNTER — Other Ambulatory Visit: Payer: Self-pay | Admitting: Otolaryngology

## 2018-07-28 DIAGNOSIS — H900 Conductive hearing loss, bilateral: Secondary | ICD-10-CM | POA: Diagnosis not present

## 2018-07-28 DIAGNOSIS — H9209 Otalgia, unspecified ear: Secondary | ICD-10-CM | POA: Diagnosis not present

## 2018-07-28 DIAGNOSIS — H6980 Other specified disorders of Eustachian tube, unspecified ear: Secondary | ICD-10-CM | POA: Diagnosis not present

## 2018-07-28 DIAGNOSIS — H6983 Other specified disorders of Eustachian tube, bilateral: Secondary | ICD-10-CM | POA: Diagnosis not present

## 2018-08-02 ENCOUNTER — Encounter: Payer: Self-pay | Admitting: Family Medicine

## 2018-08-02 ENCOUNTER — Ambulatory Visit: Payer: 59 | Admitting: Family Medicine

## 2018-08-02 VITALS — BP 129/78 | HR 72 | Temp 98.0°F | Resp 16 | Wt 124.0 lb

## 2018-08-02 DIAGNOSIS — H6992 Unspecified Eustachian tube disorder, left ear: Secondary | ICD-10-CM

## 2018-08-02 DIAGNOSIS — R21 Rash and other nonspecific skin eruption: Secondary | ICD-10-CM | POA: Diagnosis not present

## 2018-08-02 MED ORDER — HYDROCORTISONE 2.5 % EX CREA: TOPICAL_CREAM | Freq: Two times a day (BID) | CUTANEOUS | 0 refills | Status: AC

## 2018-08-02 NOTE — Progress Notes (Signed)
Patient: Crystal Higgins Female    DOB: Mar 06, 1961   57 y.o.   MRN: [id number] Visit Date: 08/02/2018  Today's Provider: Lelon Huh, MD   Chief Complaint  Patient presents with  . Follow-up  . Rash   Subjective:    HPI  Allergic reaction, initial encounter From 07/16/2018-seen by Grace Bushy. given rx for prednisone taper. Advised to call if symptoms worsen.  Was prescribed prednisone for allergic reaction and azithromycin for sore throat, ear pain and URI.  Subsequently had evaluation with Dr. Pryor Ochoa and recommended CT scan due to recurrent ear symptoms.   Patient has completed antibiotic and prednisone, however rash improved, but has not cleared up completely. Rash on neck and behind ears   Allergies  Allergen Reactions  . Codeine Nausea And Vomiting  . Doxycycline Nausea Only  . Penicillins Other (See Comments)    Heart failure @ 75 months old  . Levaquin [Levofloxacin] Itching and Other (See Comments)    Flushing, Burning     Current Outpatient Medications:  .  butalbital-acetaminophen-caffeine (FIORICET, ESGIC) 50-325-40 MG tablet, TAKE 1-2 TABLETS BY MOUTH EVERY 6 HOURS AS NEEDED, Disp: 30 tablet, Rfl: 5 .  cholecalciferol (VITAMIN D) 1000 UNITS tablet, Take by mouth., Disp: , Rfl:  .  docusate sodium (COLACE) 100 MG capsule, Take 100 mg by mouth daily., Disp: , Rfl:  .  Multiple Vitamin (MULTIVITAMIN WITH MINERALS) TABS tablet, Take 1 tablet by mouth daily., Disp: , Rfl:  .  omeprazole (PRILOSEC) 20 MG capsule, TAKE 1 CAPSULE BY MOUTH EVERY DAY, Disp: 90 capsule, Rfl: 4 .  polyethylene glycol powder (MIRALAX) powder, Take 1 Container by mouth once., Disp: , Rfl:  .  SUMAtriptan (IMITREX) 50 MG tablet, Take 1 tablet (50 mg total) by mouth every 2 (two) hours as needed for migraine. May repeat in 2 hours if headache persists or recurs., Disp: 8 tablet, Rfl: 3 .  azithromycin (ZITHROMAX) 250 MG tablet, Take 2 tablets PO on day one, and one tablet PO daily  thereafter until completed. (Patient not taking: Reported on 08/02/2018), Disp: 6 tablet, Rfl: 0 .  predniSONE (STERAPRED UNI-PAK 21 TAB) 10 MG (21) TBPK tablet, 6 day taper; take as directed on package instructions (Patient not taking: Reported on 08/02/2018), Disp: 21 tablet, Rfl: 0  Review of Systems  Constitutional: Negative for appetite change, chills, fatigue and fever.  Respiratory: Negative for chest tightness and shortness of breath.   Cardiovascular: Negative for chest pain and palpitations.  Gastrointestinal: Negative for abdominal pain, nausea and vomiting.  Neurological: Negative for dizziness and weakness.    Social History   Tobacco Use  . Smoking status: Never Smoker  . Smokeless tobacco: Never Used  Substance Use Topics  . Alcohol use: No   Objective:   BP 129/78 (BP Location: Right Arm, Patient Position: Sitting, Cuff Size: Normal)   Pulse 72   Temp 98 F (36.7 C) (Oral)   Resp 16   Wt 124 lb (56.2 kg)   SpO2 97%   BMI 21.28 kg/m  Vitals:   08/02/18 1549  BP: 129/78  Pulse: 72  Resp: 16  Temp: 98 F (36.7 C)  TempSrc: Oral  SpO2: 97%  Weight: 124 lb (56.2 kg)     Physical Exam  General Appearance:    Alert, cooperative, no distress  HENT:   right TM normal without fluid or infection, left TM fluid noted, neck without nodes and throat normal without erythema or exudate  Eyes:    PERRL, conjunctiva/corneas clear, EOM's intact       Lungs:     Clear to auscultation bilaterally, respirations unlabored  Heart:    Regular rate and rhythm  Derm:   Faint diffuse macular lesions as per HPI.            Assessment & Plan:     1. Rash Improving, but not resolved. Possible through indirect pcn exposure.  Can use prescription hc 2.5% cream untl resolved.   2. Eustachian tube disorder, left Being followed by Dr. Pryor Ochoa.        Lelon Huh, MD  [facility]

## 2018-08-05 ENCOUNTER — Ambulatory Visit
Admission: RE | Admit: 2018-08-05 | Discharge: 2018-08-05 | Disposition: A | Discharge status: Home / Self Care | Payer: 59 | Source: Ambulatory Visit | Attending: Otolaryngology | Admitting: Otolaryngology

## 2018-08-05 DIAGNOSIS — R930 Abnormal findings on diagnostic imaging of skull and head, not elsewhere classified: Secondary | ICD-10-CM | POA: Diagnosis not present

## 2018-08-05 DIAGNOSIS — H900 Conductive hearing loss, bilateral: Secondary | ICD-10-CM | POA: Diagnosis not present

## 2018-08-09 DIAGNOSIS — R42 Dizziness and giddiness: Secondary | ICD-10-CM | POA: Diagnosis not present

## 2018-08-09 DIAGNOSIS — H9202 Otalgia, left ear: Secondary | ICD-10-CM | POA: Diagnosis not present

## 2018-08-11 ENCOUNTER — Telehealth: Payer: Self-pay | Admitting: Family Medicine

## 2018-08-11 NOTE — Telephone Encounter (Signed)
Patient called back and stated she finally heard back from ENT and her request for an antibiotic is being sent to another provider there. Patient stated she should hear back from them in the morning. Patient states we can disregard previous message.

## 2018-08-11 NOTE — Telephone Encounter (Signed)
Please advise 

## 2018-08-11 NOTE — Telephone Encounter (Signed)
Pt called saying she was in last week to see Dr. Caryn Section and was referred to an ENT,  Dr. 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  Dr. Pryor Ochoa is referring her to someone in [place].  She is still having ear pain.  She has called [facility].   They told her Dr. Pryor Ochoa was not in.  She is asking if you will send in an antibiotic.  Pt's call back is [phone]  [facility]  Thanks teri

## 2018-08-12 MED ORDER — CEPHALEXIN 500 MG PO CAPS: 500.0000 mg | ORAL_CAPSULE | Freq: Four times a day (QID) | ORAL | 0 refills | Status: AC

## 2018-08-12 NOTE — Addendum Note (Signed)
Addended by: Birdie Sons on: 08/12/2018 04:07 PM   Modules accepted: Orders

## 2018-08-12 NOTE — Telephone Encounter (Signed)
Have sent prescription or cephalexin.

## 2018-08-12 NOTE — Telephone Encounter (Signed)
Please review. Are you willing to send in something else for the patient, or does she need to call ENT? Thanks

## 2018-08-12 NOTE — Telephone Encounter (Signed)
Pt called back to let Sharyn Lull know the ENT prescribed amoxicillin.  She is allergic to penicillin. Needing a call back to discuss what can be done to help her before the weekend.  Please advise.  Thanks, [employer]

## 2018-08-13 NOTE — Telephone Encounter (Signed)
Patient advised.

## 2018-09-01 DIAGNOSIS — R42 Dizziness and giddiness: Secondary | ICD-10-CM | POA: Diagnosis not present

## 2018-09-01 DIAGNOSIS — H9209 Otalgia, unspecified ear: Secondary | ICD-10-CM | POA: Diagnosis not present

## 2018-11-09 ENCOUNTER — Encounter: Payer: Self-pay | Admitting: Physician Assistant

## 2018-11-09 ENCOUNTER — Ambulatory Visit: Payer: 59 | Admitting: Physician Assistant

## 2018-11-09 VITALS — BP 112/75 | HR 67 | Temp 97.9°F | Resp 16 | Wt 126.8 lb

## 2018-11-09 DIAGNOSIS — H6982 Other specified disorders of Eustachian tube, left ear: Secondary | ICD-10-CM | POA: Diagnosis not present

## 2018-11-09 DIAGNOSIS — H7292 Unspecified perforation of tympanic membrane, left ear: Secondary | ICD-10-CM

## 2018-11-09 MED ORDER — PREDNISONE 20 MG PO TABS: 40.0000 mg | ORAL_TABLET | Freq: Every day | ORAL | 0 refills | Status: DC

## 2018-11-09 MED ORDER — AZITHROMYCIN 250 MG PO TABS: ORAL_TABLET | ORAL | 0 refills | Status: DC

## 2018-11-09 NOTE — Patient Instructions (Signed)

## 2018-11-09 NOTE — Progress Notes (Signed)
Patient: Crystal Higgins Female    DOB: Feb 22, 1961   57 y.o.   MRN: [id number] Visit Date: 11/09/2018  Today's Provider: Mar Daring, PA-C   Chief Complaint  Patient presents with  . Ear Pain   Subjective:     HPI Patient here today c/o left ear pain x's 4 days. Patient denies any discharge, cough, runny nose or fever. Patient reports sore throat x's one day. Reports she had been doing well with no ear pain since September, but recently went to visit her husband's family in [place] last week and reports they were going through the mountains and she felt her ear pop and pain started immediately and has not improved since. 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    Allergies  Allergen Reactions  . Codeine Nausea And Vomiting  . Doxycycline Nausea Only  . Penicillins Other (See Comments)    Heart failure @ 26 months old  . Levaquin [Levofloxacin] Itching and Other (See Comments)    Flushing, Burning     Current Outpatient Medications:  .  butalbital-acetaminophen-caffeine (FIORICET, ESGIC) 50-325-40 MG tablet, TAKE 1-2 TABLETS BY MOUTH EVERY 6 HOURS AS NEEDED, Disp: 30 tablet, Rfl: 5 .  cholecalciferol (VITAMIN D) 1000 UNITS tablet, Take by mouth., Disp: , Rfl:  .  docusate sodium (COLACE) 100 MG capsule, Take 100 mg by mouth daily., Disp: , Rfl:  .  hydrocortisone 2.5 % cream, Apply topically 2 (two) times daily., Disp: 30 g, Rfl: 0 .  Multiple Vitamin (MULTIVITAMIN WITH MINERALS) TABS tablet, Take 1 tablet by mouth daily., Disp: , Rfl:  .  omeprazole (PRILOSEC) 20 MG capsule, TAKE 1 CAPSULE BY MOUTH EVERY DAY, Disp: 90 capsule, Rfl: 4 .  polyethylene glycol powder (MIRALAX) powder, Take 1 Container by mouth once., Disp: , Rfl:  .  SUMAtriptan (IMITREX) 50 MG tablet, Take 1 tablet (50 mg total) by mouth every 2 (two) hours as needed for migraine. May repeat in 2 hours if headache persists or recurs., Disp: 8 tablet,  Rfl: 3 .  vitamin B-12 (CYANOCOBALAMIN) 500 MCG tablet, Take 500 mcg by mouth every other day., Disp: , Rfl:   Review of Systems  Constitutional: Negative.   HENT: Positive for ear pain, hearing loss (unchanged from baseline) and sore throat. Negative for congestion, postnasal drip, sinus pressure, sinus pain and tinnitus.   Respiratory: Negative.   Cardiovascular: Negative.   Neurological: Positive for headaches.    Social History   Tobacco Use  . Smoking status: Never Smoker  . Smokeless tobacco: Never Used  Substance Use Topics  . Alcohol use: No      Objective:   BP 112/75 (BP Location: Left Arm, Patient Position: Sitting, Cuff Size: Normal)   Pulse 67   Temp 97.9 F (36.6 C) (Oral)   Resp 16   Wt 126 lb 12.8 oz (57.5 kg)   SpO2 97%   BMI 21.77 kg/m  Vitals:   11/09/18 1107  BP: 112/75  Pulse: 67  Resp: 16  Temp: 97.9 F (36.6 C)  TempSrc: Oral  SpO2: 97%  Weight: 126 lb 12.8 oz (57.5 kg)     Physical Exam Vitals signs reviewed.  Constitutional:      General: She is not in acute distress.    Appearance: She is well-developed and normal weight. She is not diaphoretic.  HENT:     Head: Normocephalic and atraumatic.  Right Ear: Hearing, tympanic membrane, ear canal and external ear normal.     Left Ear: Hearing, ear canal and external ear normal. Tympanic membrane is perforated and retracted.     Ears:      Nose: Nose normal.     Mouth/Throat:     Pharynx: Uvula midline. No oropharyngeal exudate.  Eyes:     General: No scleral icterus.       Right eye: No discharge.        Left eye: No discharge.     Conjunctiva/sclera: Conjunctivae normal.     Pupils: Pupils are equal, round, and reactive to light.  Neck:     Musculoskeletal: Normal range of motion and neck supple.     Thyroid: No thyromegaly.     Trachea: No tracheal deviation.  Cardiovascular:     Rate and Rhythm: Normal rate and regular rhythm.     Heart sounds: Normal heart sounds. No  murmur. No friction rub. No gallop.   Pulmonary:     Effort: Pulmonary effort is normal. No respiratory distress.     Breath sounds: Normal breath sounds. No stridor. No wheezing or rales.  Lymphadenopathy:     Cervical: No cervical adenopathy.  Skin:    General: Skin is warm and dry.  Neurological:     Mental Status: She is alert.         Assessment & Plan    1. Dysfunction of left eustachian tube Suspect ETD causing pain and retraction. Unable to tolerate nasal spray at this time due to nose bleeds from dry air. Will give steroid taper to see if that will help relieve inflammation and pressure on ET. If no improvements may need to follow up with Dr. Honor Jun[facility] at [facility].  - predniSONE (DELTASONE) 20 MG tablet; Take 2 tablets (40 mg total) by mouth daily with breakfast.  Dispense: 8 tablet; Refill: 0  2. Perforation of left tympanic membrane Due to small perforation, even though healing, and patient with SSCD, I will treat with zpak as below. Call if no improvements.  - azithromycin (ZITHROMAX) 250 MG tablet; Take 2 tablets PO on day one, and one tablet PO daily thereafter until completed.  Dispense: 6 tablet; Refill: 0     Mar Daring, PA-C  [facility]

## 2018-12-09 ENCOUNTER — Other Ambulatory Visit: Payer: Self-pay | Admitting: Family Medicine

## 2018-12-09 NOTE — Telephone Encounter (Signed)
Pharmacy requesting refills. Thanks!  

## 2018-12-27 ENCOUNTER — Ambulatory Visit: Payer: BLUE CROSS/BLUE SHIELD | Admitting: Family Medicine

## 2018-12-27 ENCOUNTER — Encounter: Payer: Self-pay | Admitting: Family Medicine

## 2018-12-27 VITALS — BP 121/76 | HR 71 | Temp 97.9°F | Wt 127.0 lb

## 2018-12-27 DIAGNOSIS — G43809 Other migraine, not intractable, without status migrainosus: Secondary | ICD-10-CM | POA: Diagnosis not present

## 2018-12-27 DIAGNOSIS — H66005 Acute suppurative otitis media without spontaneous rupture of ear drum, recurrent, left ear: Secondary | ICD-10-CM

## 2018-12-27 MED ORDER — SUMATRIPTAN SUCCINATE 25 MG PO TABS: 25.0000 mg | ORAL_TABLET | Freq: Every day | ORAL | 3 refills | Status: DC | PRN

## 2018-12-27 MED ORDER — GABAPENTIN 300 MG PO CAPS: 300.0000 mg | ORAL_CAPSULE | Freq: Every day | ORAL | 2 refills | Status: DC

## 2018-12-27 MED ORDER — CLARITHROMYCIN 500 MG PO TABS: 500.0000 mg | ORAL_TABLET | Freq: Two times a day (BID) | ORAL | 0 refills | Status: DC

## 2018-12-27 NOTE — Progress Notes (Signed)
Patient: Crystal Higgins Female    DOB: 1961-04-18   58 y.o.   MRN: [id number] Visit Date: 12/27/2018  Today's Provider: Lelon Huh, MD   Chief Complaint  Patient presents with  . Ear Pain    left ear   Subjective:     Otalgia   There is pain in the left ear. This is a recurrent problem. The current episode started in the past 7 days. The problem occurs constantly. The problem has been gradually worsening. There has been no fever. The pain is at a severity of 6/10. Associated symptoms include ear discharge, headaches and hearing loss. Pertinent negatives include no rhinorrhea or sore throat.  Headache   This is a chronic problem. The pain is located in the occipital region. The pain radiates to the face. The pain quality is similar to prior headaches. The quality of the pain is described as aching. Associated symptoms include dizziness (ongoing issue, patient reports its stable), ear pain, hearing loss and tinnitus. Pertinent negatives include no nausea (had the symptoms Sunday but is better today), phonophobia, photophobia, rhinorrhea, sinus pressure or sore throat. She has tried triptans (Pt feels like her medications are not working anymore and would like to discuss other treatment options. ) for the symptoms. The treatment provided no relief.   States headaches have been occurring every day for years, but more severe the last few months. Only tolerates 1/2 tablet of Imitrex. Tried amitriptyline, nortriptyline,  and topiramate in the past which she didn't tolerate due to aggravated parotiditis  Allergies  Allergen Reactions  . Codeine Nausea And Vomiting  . Doxycycline Nausea Only  . Penicillins Other (See Comments)    Heart failure @ 40 months old  . Levaquin [Levofloxacin] Itching and Other (See Comments)    Flushing, Burning     Current Outpatient Medications:  .  butalbital-acetaminophen-caffeine (FIORICET, ESGIC) 50-325-40 MG tablet, TAKE 1 TO 2 TABLETS EVERY 6 HOURS  AS NEEDED, Disp: 30 tablet, Rfl: 3 .  cholecalciferol (VITAMIN D) 1000 UNITS tablet, Take by mouth., Disp: , Rfl:  .  docusate sodium (COLACE) 100 MG capsule, Take 100 mg by mouth daily., Disp: , Rfl:  .  hydrocortisone 2.5 % cream, Apply topically 2 (two) times daily., Disp: 30 g, Rfl: 0 .  Multiple Vitamin (MULTIVITAMIN WITH MINERALS) TABS tablet, Take 1 tablet by mouth daily., Disp: , Rfl:  .  omeprazole (PRILOSEC) 20 MG capsule, TAKE 1 CAPSULE BY MOUTH EVERY DAY, Disp: 90 capsule, Rfl: 4 .  polyethylene glycol powder (MIRALAX) powder, Take 1 Container by mouth once., Disp: , Rfl:  .  SUMAtriptan (IMITREX) 50 MG tablet, Take 1 tablet (50 mg total) by mouth every 2 (two) hours as needed for migraine. May repeat in 2 hours if headache persists or recurs., Disp: 8 tablet, Rfl: 3 .  vitamin B-12 (CYANOCOBALAMIN) 500 MCG tablet, Take 500 mcg by mouth every other day., Disp: , Rfl:   Review of Systems  HENT: Positive for ear discharge, ear pain, hearing loss and tinnitus. Negative for congestion, mouth sores, nosebleeds, postnasal drip, rhinorrhea, sinus pressure, sinus pain, sneezing, sore throat, trouble swallowing and voice change.   Eyes: Negative.  Negative for photophobia.  Respiratory: Negative.   Gastrointestinal: Negative for nausea (had the symptoms Sunday but is better today).  Neurological: Positive for dizziness (ongoing issue, patient reports its stable) and headaches.    Social History   Tobacco Use  . Smoking status: Never Smoker  .  Smokeless tobacco: Never Used  Substance Use Topics  . Alcohol use: No      Objective:   BP 121/76 (BP Location: Right Arm, Patient Position: Sitting, Cuff Size: Normal)   Pulse 71   Temp 97.9 F (36.6 C) (Oral)   Wt 127 lb (57.6 kg)   BMI 21.80 kg/m  Vitals:   12/27/18 1556  BP: 121/76  Pulse: 71  Temp: 97.9 F (36.6 C)  TempSrc: Oral  Weight: 127 lb (57.6 kg)     Physical Exam  General Appearance:    Alert, cooperative, no  distress  HENT:   right TM normal without fluid or infection and left TM fluid noted  Eyes:    PERRL, conjunctiva/corneas clear, EOM's intact       Lungs:     Clear to auscultation bilaterally, respirations unlabored  Heart:    Regular rate and rhythm  Neurologic:   Awake, alert, oriented x 3. No apparent focal neurological           defect.         Assessment & Plan    1. Recurrent acute suppurative otitis media without spontaneous rupture of left tympanic membrane  - clarithromycin (BIAXIN) 500 MG tablet; Take 1 tablet (500 mg total) by mouth 2 (two) times daily for 10 days.  Dispense: 20 tablet; Refill: 0  2. Other migraine without status migrainosus, not intractable  - SUMAtriptan (IMITREX) 25 MG tablet; Take 1 tablet (25 mg total) by mouth daily as needed for migraine. May repeat in 2 hours if headache persists or recurs.  Dispense: 10 tablet; Refill: 3  try - gabapentin (NEURONTIN) 300 MG capsule; Take 1 capsule (300 mg total) by mouth at bedtime.  Dispense: 30 capsule; Refill: 2 for prophylaxis     Lelon Huh, MD  [facility]

## 2018-12-30 NOTE — Patient Instructions (Signed)
.   Please review the attached list of medications and notify my office if there are any errors.   . Please bring all of your medications to every appointment so we can make sure that our medication list is the same as yours.   

## 2019-01-02 ENCOUNTER — Telehealth: Payer: Self-pay | Admitting: Family Medicine

## 2019-01-02 MED ORDER — SULFAMETHOXAZOLE-TRIMETHOPRIM 800-160 MG PO TABS: 1.0000 | ORAL_TABLET | Freq: Two times a day (BID) | ORAL | 0 refills | Status: AC

## 2019-01-02 NOTE — Telephone Encounter (Signed)
t called saying she was in last Tuesday for an ear infection but she does not think the antibiotic is working.  She wants to know if she can get the other antibiotic that you had suggested  She uses [facility]  CB#  [phone]  Thanks Con Memos

## 2019-01-02 NOTE — Telephone Encounter (Signed)
Pt advised.   Thanks,   -Nivan Melendrez  

## 2019-01-26 DIAGNOSIS — R21 Rash and other nonspecific skin eruption: Secondary | ICD-10-CM | POA: Diagnosis not present

## 2019-02-02 ENCOUNTER — Telehealth: Payer: Self-pay

## 2019-02-02 MED ORDER — AZITHROMYCIN 250 MG PO TABS: ORAL_TABLET | ORAL | 0 refills | Status: AC

## 2019-02-02 NOTE — Telephone Encounter (Signed)
Patient called office stating that she has pain in her left ear and drainage for one week. Patient reports that this is a chronic problem and is usual prescribed antibiotic (Azithromycin) by Dr. Caryn Section. Patient is requesting Dr. Caryn Section send in antibiotic to [place]. KW

## 2019-02-20 ENCOUNTER — Other Ambulatory Visit: Payer: Self-pay

## 2019-02-20 ENCOUNTER — Encounter: Payer: Self-pay | Admitting: Family Medicine

## 2019-02-20 ENCOUNTER — Ambulatory Visit: Payer: BLUE CROSS/BLUE SHIELD | Admitting: Family Medicine

## 2019-02-20 VITALS — BP 127/82 | HR 69 | Temp 98.2°F | Wt 124.0 lb

## 2019-02-20 DIAGNOSIS — H6692 Otitis media, unspecified, left ear: Secondary | ICD-10-CM

## 2019-02-20 MED ORDER — CEPHALEXIN 500 MG PO CAPS: 500.0000 mg | ORAL_CAPSULE | Freq: Four times a day (QID) | ORAL | 0 refills | Status: AC

## 2019-02-20 NOTE — Patient Instructions (Signed)
.   Please review the attached list of medications and notify my office if there are any errors.   . Please bring all of your medications to every appointment so we can make sure that our medication list is the same as yours.   

## 2019-02-20 NOTE — Progress Notes (Signed)
Patient: Crystal Higgins Female    DOB: 1961-01-01   58 y.o.   MRN: [id number] Visit Date: 02/20/2019  Today's Provider: Lelon Huh, MD   Chief Complaint  Patient presents with  . Sore Throat    Started about a week ago.  . Ear Pain    Left ear; started yesterday   Subjective:     Sore Throat   This is a new problem. The current episode started in the past 7 days (Started about a week ago. ). The problem has been gradually worsening. The pain is worse on the left side. There has been no fever. Associated symptoms include congestion, coughing, ear pain (Left ear pain) and trouble swallowing. Pertinent negatives include no abdominal pain, diarrhea, ear discharge, headaches, neck pain, shortness of breath, stridor or vomiting.  Otalgia   There is pain in the left ear. This is a new problem. The current episode started yesterday. The problem has been gradually worsening. There has been no fever. Associated symptoms include coughing and a sore throat. Pertinent negatives include no abdominal pain, diarrhea, ear discharge, headaches, hearing loss, neck pain, rash, rhinorrhea or vomiting.    Allergies  Allergen Reactions  . Amitriptyline   . Codeine Nausea And Vomiting  . Doxycycline Nausea Only  . Nortriptyline   . Penicillins Other (See Comments)    Heart failure @ 61 months old  . Levaquin [Levofloxacin] Itching and Other (See Comments)    Flushing, Burning     Current Outpatient Medications:  .  butalbital-acetaminophen-caffeine (FIORICET, ESGIC) 50-325-40 MG tablet, TAKE 1 TO 2 TABLETS EVERY 6 HOURS AS NEEDED, Disp: 30 tablet, Rfl: 3 .  cholecalciferol (VITAMIN D) 1000 UNITS tablet, Take by mouth., Disp: , Rfl:  .  docusate sodium (COLACE) 100 MG capsule, Take 100 mg by mouth daily., Disp: , Rfl:  .  hydrocortisone 2.5 % cream, Apply topically 2 (two) times daily., Disp: 30 g, Rfl: 0 .  Multiple Vitamin (MULTIVITAMIN WITH MINERALS) TABS tablet, Take 1 tablet by mouth  daily., Disp: , Rfl:  .  omeprazole (PRILOSEC) 20 MG capsule, TAKE 1 CAPSULE BY MOUTH EVERY DAY, Disp: 90 capsule, Rfl: 4 .  polyethylene glycol powder (MIRALAX) powder, Take 1 Container by mouth once., Disp: , Rfl:  .  SUMAtriptan (IMITREX) 25 MG tablet, Take 1 tablet (25 mg total) by mouth daily as needed for migraine. May repeat in 2 hours if headache persists or recurs., Disp: 10 tablet, Rfl: 3 .  vitamin B-12 (CYANOCOBALAMIN) 500 MCG tablet, Take 500 mcg by mouth every other day., Disp: , Rfl:  .  gabapentin (NEURONTIN) 300 MG capsule, Take 1 capsule (300 mg total) by mouth at bedtime. (Patient not taking: Reported on 02/20/2019), Disp: 30 capsule, Rfl: 2  Review of Systems  Constitutional: Negative.   HENT: Positive for congestion, ear pain (Left ear pain), sore throat, trouble swallowing and voice change. Negative for ear discharge, hearing loss, postnasal drip, rhinorrhea, sinus pressure, sinus pain, sneezing and tinnitus.   Eyes: Negative.   Respiratory: Positive for cough. Negative for apnea, choking, chest tightness, shortness of breath, wheezing and stridor.   Gastrointestinal: Negative.  Negative for abdominal pain, diarrhea and vomiting.  Musculoskeletal: Negative for neck pain.  Skin: Negative for rash.  Neurological: Negative for dizziness, light-headedness and headaches.    Social History   Tobacco Use  . Smoking status: Never Smoker  . Smokeless tobacco: Never Used  Substance Use Topics  . Alcohol use:  No      Objective:   BP 127/82 (BP Location: Left Arm, Patient Position: Sitting, Cuff Size: Normal)   Pulse 69   Temp 98.2 F (36.8 C) (Oral)   Wt 124 lb (56.2 kg)   BMI 21.28 kg/m  Vitals:   02/20/19 1010  BP: 127/82  Pulse: 69  Temp: 98.2 F (36.8 C)  TempSrc: Oral  Weight: 124 lb (56.2 kg)     Physical Exam  General Appearance:    Alert, cooperative, no distress  HENT:   right TM normal without fluid or infection, left TM fluid noted, neck has left  anterior cervical nodes enlarged, pharynx erythematous without exudate, sinuses nontender and nasal mucosa congested  Eyes:    PERRL, conjunctiva/corneas clear, EOM's intact       Lungs:     Clear to auscultation bilaterally, respirations unlabored  Heart:    Regular rate and rhythm  Neurologic:   Awake, alert, oriented x 3. No apparent focal neurological           defect.           Assessment & Plan         Lelon Huh, MD  [facility]

## 2019-04-12 ENCOUNTER — Encounter: Payer: Self-pay | Admitting: Physician Assistant

## 2019-04-12 ENCOUNTER — Other Ambulatory Visit: Payer: Self-pay

## 2019-04-12 ENCOUNTER — Telehealth: Payer: Self-pay

## 2019-04-12 ENCOUNTER — Ambulatory Visit: Payer: BLUE CROSS/BLUE SHIELD | Admitting: Physician Assistant

## 2019-04-12 ENCOUNTER — Other Ambulatory Visit: Payer: Self-pay | Admitting: Obstetrics and Gynecology

## 2019-04-12 VITALS — BP 130/82 | HR 69 | Temp 97.5°F | Resp 15 | Wt 127.2 lb

## 2019-04-12 DIAGNOSIS — Z1231 Encounter for screening mammogram for malignant neoplasm of breast: Secondary | ICD-10-CM

## 2019-04-12 DIAGNOSIS — H6092 Unspecified otitis externa, left ear: Secondary | ICD-10-CM | POA: Diagnosis not present

## 2019-04-12 DIAGNOSIS — H6690 Otitis media, unspecified, unspecified ear: Secondary | ICD-10-CM

## 2019-04-12 MED ORDER — NEOMYCIN-COLIST-HC-THONZONIUM 3.3-3-10-0.5 MG/ML OT SUSP: 4.0000 [drp] | Freq: Four times a day (QID) | OTIC | 0 refills | Status: DC

## 2019-04-12 NOTE — Progress Notes (Signed)
Patient: Crystal Higgins Female    DOB: [birth year]   58 y.o.   MRN: [id number] Visit Date: 04/12/2019  Today's Provider: Trinna Post, PA-C   Chief Complaint  Patient presents with  . Ear Pain   Subjective:     Otalgia   There is pain in the left ear. This is a new problem. The current episode started in the past 7 days. The problem occurs constantly. The problem has been gradually worsening. There has been no fever. The pain is at a severity of 6/10. Associated symptoms include headaches, hearing loss (patient states that she has hearing loss in both ears patient states begain in 07/2018) and a sore throat. Pertinent negatives include no abdominal pain, coughing, diarrhea, ear discharge, neck pain, rash, rhinorrhea or vomiting. She has tried acetaminophen for the symptoms. Her past medical history is significant for a chronic ear infection and hearing loss.   Says she has chronic ear infections and is on antibiotics every 6-8 weeks. Is followed by ENT.   Allergies  Allergen Reactions  . Amitriptyline   . Codeine Nausea And Vomiting  . Doxycycline Nausea Only  . Nortriptyline   . Penicillins Other (See Comments)    Heart failure @ 34 months old  . Levaquin [Levofloxacin] Itching and Other (See Comments)    Flushing, Burning     Current Outpatient Medications:  .  butalbital-acetaminophen-caffeine (FIORICET, ESGIC) 50-325-40 MG tablet, TAKE 1 TO 2 TABLETS EVERY 6 HOURS AS NEEDED, Disp: 30 tablet, Rfl: 3 .  cholecalciferol (VITAMIN D) 1000 UNITS tablet, Take by mouth., Disp: , Rfl:  .  docusate sodium (COLACE) 100 MG capsule, Take 100 mg by mouth daily., Disp: , Rfl:  .  hydrocortisone 2.5 % cream, Apply topically 2 (two) times daily., Disp: 30 g, Rfl: 0 .  Multiple Vitamin (MULTIVITAMIN WITH MINERALS) TABS tablet, Take 1 tablet by mouth daily., Disp: , Rfl:  .  omeprazole (PRILOSEC) 20 MG capsule, TAKE 1 CAPSULE BY MOUTH EVERY DAY, Disp: 90 capsule, Rfl: 4 .   polyethylene glycol powder (MIRALAX) powder, Take 1 Container by mouth once., Disp: , Rfl:  .  SUMAtriptan (IMITREX) 25 MG tablet, Take 1 tablet (25 mg total) by mouth daily as needed for migraine. May repeat in 2 hours if headache persists or recurs., Disp: 10 tablet, Rfl: 3 .  vitamin B-12 (CYANOCOBALAMIN) 500 MCG tablet, Take 500 mcg by mouth every other day., Disp: , Rfl:   Review of Systems  HENT: Positive for ear pain, hearing loss (patient states that she has hearing loss in both ears patient states begain in 07/2018) and sore throat. Negative for ear discharge and rhinorrhea.   Respiratory: Negative for cough.   Gastrointestinal: Negative for abdominal pain, diarrhea and vomiting.  Musculoskeletal: Negative for neck pain.  Skin: Negative for rash.  Neurological: Positive for headaches.    Social History   Tobacco Use  . Smoking status: Never Smoker  . Smokeless tobacco: Never Used  Substance Use Topics  . Alcohol use: No      Objective:   BP 130/82   Pulse 69   Temp (!) 97.5 F (36.4 C) (Oral)   Resp 15   Wt 127 lb 3.2 oz (57.7 kg)   BMI 21.83 kg/m  Vitals:   04/12/19 1529  BP: 130/82  Pulse: 69  Resp: 15  Temp: (!) 97.5 F (36.4 C)  TempSrc: Oral  Weight: 127 lb 3.2 oz (57.7 kg)  Physical Exam Constitutional:      Appearance: Normal appearance. She is not ill-appearing.  HENT:     Right Ear: Tympanic membrane and ear canal normal.     Left Ear: Swelling present. Tympanic membrane is not injected, perforated, erythematous or bulging.     Ears:     Comments: Left TM opaque but not red or bulging. Canal erythematous and swollen. Ear tender to tragal manipulation.  Pulmonary:     Effort: No respiratory distress.  Lymphadenopathy:     Cervical: No cervical adenopathy.  Skin:    General: Skin is warm and dry.  Neurological:     Mental Status: She is alert and oriented to person, place, and time. Mental status is at baseline.  Psychiatric:        Mood  and Affect: Mood normal.        Behavior: Behavior normal.         Assessment & Plan    1. Otitis externa of left ear, unspecified chronicity, unspecified type  Recommend drops as below.  Patient called back stating drops are too expensive and would like oral abx. Counseled these may not be effective for outer ear infection. Will give what she received last time from Dr. Caryn Section, which is Keflex.   - neomycin-colistin-hydrocortisone-thonzonium (CORTISPORIN-TC) 3.01-23-09-0.5 MG/ML OTIC suspension; Place 4 drops into the left ear 4 (four) times daily.  Dispense: 10 mL; Refill: 0  The entirety of the information documented in the History of Present Illness, Review of Systems and Physical Exam were personally obtained by me. Portions of this information were initially documented by Jennings Books, CMA and reviewed by me for thoroughness and accuracy.         Trinna Post, PA-C  [facility] Patient seen and examined by Carles Collet, PA-C, note scribed by Jennings Books, [place]

## 2019-04-12 NOTE — Telephone Encounter (Signed)
Pt was in the office today for an ear infection.  She states the ear drops that was prescribed to her is too expensive and the pharmacy does not have it in stock.  She would like a different prescription sent to [facility].    Please advise.   Thanks,   -Mickel Baas

## 2019-04-12 NOTE — Patient Instructions (Signed)

## 2019-04-13 MED ORDER — CEPHALEXIN 500 MG PO CAPS: 500.0000 mg | ORAL_CAPSULE | Freq: Four times a day (QID) | ORAL | 0 refills | Status: AC

## 2019-04-13 NOTE — Telephone Encounter (Signed)
Pill probably won't work on outer ear infection but I'll give what Dr. Caryn Section gave last time.

## 2019-04-13 NOTE — Telephone Encounter (Signed)
Advised patient as below.  

## 2019-04-13 NOTE — Telephone Encounter (Signed)
Patient called to get an update on this message. Should would like someone to call her once the new prescription is sent into the pharmacy. She also wants to know if it could be changed to pills instead of ear drops. Patient states she has never done ear drops before.

## 2019-04-18 ENCOUNTER — Telehealth: Payer: Self-pay

## 2019-04-18 MED ORDER — ACETIC ACID 2 % OT SOLN: 4.0000 [drp] | Freq: Four times a day (QID) | OTIC | 0 refills | Status: DC

## 2019-04-18 NOTE — Telephone Encounter (Signed)
Patient called saying that she was seen by [facility] for an inner ear infection about 1 week ago, and she initially prescribed ear drops. However, she was unable to get them because it was too expensive. She called back to the office and requested an oral antibiotic instead. Patient reports that she has not noticed any improvement in her ear, and it is starting to drain fluid. She is wanting Dr. Caryn Section to call in a cheaper ear drop to see if that will help. Please advise. [facility]. Thanks!

## 2019-04-18 NOTE — Telephone Encounter (Signed)
Patient advised.

## 2019-04-18 NOTE — Telephone Encounter (Signed)
Have sent new prescription to [facility]

## 2019-04-24 DIAGNOSIS — H900 Conductive hearing loss, bilateral: Secondary | ICD-10-CM | POA: Diagnosis not present

## 2019-04-24 DIAGNOSIS — H6983 Other specified disorders of Eustachian tube, bilateral: Secondary | ICD-10-CM | POA: Diagnosis not present

## 2019-04-24 DIAGNOSIS — H60509 Unspecified acute noninfective otitis externa, unspecified ear: Secondary | ICD-10-CM | POA: Diagnosis not present

## 2019-04-27 ENCOUNTER — Ambulatory Visit
Admission: RE | Admit: 2019-04-27 | Discharge: 2019-04-27 | Disposition: A | Discharge status: Home / Self Care | Payer: BC Managed Care – PPO | Source: Ambulatory Visit | Attending: Obstetrics and Gynecology | Admitting: Obstetrics and Gynecology

## 2019-04-27 ENCOUNTER — Other Ambulatory Visit: Payer: Self-pay

## 2019-04-27 DIAGNOSIS — Z1231 Encounter for screening mammogram for malignant neoplasm of breast: Secondary | ICD-10-CM | POA: Diagnosis not present

## 2019-05-10 ENCOUNTER — Other Ambulatory Visit: Payer: Self-pay | Admitting: Family Medicine

## 2019-06-08 DIAGNOSIS — Z1211 Encounter for screening for malignant neoplasm of colon: Secondary | ICD-10-CM | POA: Diagnosis not present

## 2019-06-08 DIAGNOSIS — Z01419 Encounter for gynecological examination (general) (routine) without abnormal findings: Secondary | ICD-10-CM | POA: Diagnosis not present

## 2019-06-09 DIAGNOSIS — Z01419 Encounter for gynecological examination (general) (routine) without abnormal findings: Secondary | ICD-10-CM | POA: Diagnosis not present

## 2019-06-15 ENCOUNTER — Encounter: Payer: Self-pay | Admitting: Family Medicine

## 2019-06-16 ENCOUNTER — Other Ambulatory Visit: Payer: Self-pay

## 2019-06-16 ENCOUNTER — Ambulatory Visit: Payer: Self-pay | Admitting: Family Medicine

## 2019-06-16 ENCOUNTER — Encounter: Payer: Self-pay | Admitting: Family Medicine

## 2019-06-16 VITALS — BP 118/78 | HR 63 | Temp 98.6°F | Resp 16 | Wt 126.0 lb

## 2019-06-16 DIAGNOSIS — H66005 Acute suppurative otitis media without spontaneous rupture of ear drum, recurrent, left ear: Secondary | ICD-10-CM | POA: Diagnosis not present

## 2019-06-16 MED ORDER — AZITHROMYCIN 250 MG PO TABS: ORAL_TABLET | ORAL | 2 refills | Status: DC

## 2019-06-16 MED ORDER — AZITHROMYCIN 250 MG PO TABS: ORAL_TABLET | ORAL | 0 refills | Status: DC

## 2019-06-16 NOTE — Patient Instructions (Signed)
.   Please review the attached list of medications and notify my office if there are any errors.   . Please bring all of your medications to every appointment so we can make sure that our medication list is the same as yours.   . We will have flu vaccines available after Labor Day. Please go to your pharmacy or call the office in early September to schedule you flu shot.   

## 2019-06-16 NOTE — Progress Notes (Signed)
Patient: Crystal Higgins Female    DOB: 13-Jul-1961   58 y.o.   MRN: [id number] Visit Date: 06/16/2019  Today's Provider: Lelon Huh, MD   Chief Complaint  Patient presents with  . Otalgia   Subjective:     Otalgia  There is pain in the left ear. This is a chronic problem. Episode onset: recent flare started over 1 week ago. The problem has been gradually worsening. Pertinent negatives include no abdominal pain, coughing, hearing loss, rhinorrhea, sore throat or vomiting. Treatments tried: prescription ear drops. The treatment provided mild relief.  State pain is her typical ear infection which usually responds to short course of azithromycin. Has been follow by Dr. Pryor Ochoa and told recurrent infections are related to multiple untreated infection in childhood and changes in mobility of eardrum from scarring. Is Scheduled for tympanoplasty in August, but she states was told there is small chance that it will work and not sure if she is going to proceed.   Allergies  Allergen Reactions  . Amitriptyline   . Codeine Nausea And Vomiting  . Doxycycline Nausea Only  . Nortriptyline   . Penicillins Other (See Comments)    Heart failure @ 14 months old  . Levaquin [Levofloxacin] Itching and Other (See Comments)    Flushing, Burning     Current Outpatient Medications:  .  butalbital-acetaminophen-caffeine (FIORICET) 50-325-40 MG tablet, TAKE 1 TO 2 TABLETS BY MOUTH EVERY 6 HOURS AS NEEDED, Disp: 30 tablet, Rfl: 5 .  cholecalciferol (VITAMIN D) 1000 UNITS tablet, Take by mouth., Disp: , Rfl:  .  CIPRODEX OTIC suspension, 4 drops into left ear  twice daily, Disp: , Rfl:  .  docusate sodium (COLACE) 100 MG capsule, Take 100 mg by mouth daily., Disp: , Rfl:  .  hydrocortisone 2.5 % cream, Apply topically 2 (two) times daily., Disp: 30 g, Rfl: 0 .  Multiple Vitamin (MULTIVITAMIN WITH MINERALS) TABS tablet, Take 1 tablet by mouth daily., Disp: , Rfl:  .  omeprazole (PRILOSEC) 20 MG  capsule, TAKE 1 CAPSULE BY MOUTH EVERY DAY, Disp: 90 capsule, Rfl: 4 .  polyethylene glycol powder (MIRALAX) powder, Take 1 Container by mouth once., Disp: , Rfl:  .  SUMAtriptan (IMITREX) 25 MG tablet, Take 1 tablet (25 mg total) by mouth daily as needed for migraine. May repeat in 2 hours if headache persists or recurs., Disp: 10 tablet, Rfl: 3 .  vitamin B-12 (CYANOCOBALAMIN) 500 MCG tablet, Take 500 mcg by mouth every other day., Disp: , Rfl:   Review of Systems  Constitutional: Negative for appetite change, chills, fatigue and fever.  HENT: Positive for ear pain. Negative for hearing loss, rhinorrhea and sore throat.   Respiratory: Negative for cough, chest tightness and shortness of breath.   Cardiovascular: Negative for chest pain and palpitations.  Gastrointestinal: Negative for abdominal pain, nausea and vomiting.  Neurological: Negative for dizziness and weakness.    Social History   Tobacco Use  . Smoking status: Never Smoker  . Smokeless tobacco: Never Used  Substance Use Topics  . Alcohol use: No      Objective:   BP 118/78 (BP Location: Left Arm, Patient Position: Sitting, Cuff Size: Normal)   Pulse 63   Temp 98.6 F (37 C) (Oral)   Resp 16   Wt 126 lb (57.2 kg)   SpO2 97% Comment: room air  BMI 21.63 kg/m  Vitals:   06/16/19 0945  BP: 118/78  Pulse: 63  Resp:  16  Temp: 98.6 F (37 C)  TempSrc: Oral  SpO2: 97%  Weight: 126 lb (57.2 kg)     Physical Exam     General Appearance:    Alert, cooperative, no distress  HENT:   right TM normal without fluid or infection and neck without nodes. Left TM scarred and inflamed  Eyes:    PERRL, conjunctiva/corneas clear, EOM's intact       Lungs:     Clear to auscultation bilaterally, respirations unlabored  Heart:    Normal heart rate. Normal rhythm. No murmurs, rubs, or gallops.   Neurologic:   Awake, alert, oriented x 3. No apparent focal neurological           defect.          Assessment & Plan    1.  Recurrent acute suppurative otitis media without spontaneous rupture of left tympanic membrane  - azithromycin (ZITHROMAX) 250 MG tablet; 2 by mouth today, then 1 daily for 4 days  Dispense: 6 tablet; Refill: 2       Lelon Huh, MD  [facility]

## 2019-06-21 DIAGNOSIS — Z1211 Encounter for screening for malignant neoplasm of colon: Secondary | ICD-10-CM | POA: Diagnosis not present

## 2019-07-26 DIAGNOSIS — L309 Dermatitis, unspecified: Secondary | ICD-10-CM | POA: Diagnosis not present

## 2019-08-01 ENCOUNTER — Ambulatory Visit: Payer: BC Managed Care – PPO | Admitting: Family Medicine

## 2019-08-01 ENCOUNTER — Other Ambulatory Visit: Payer: Self-pay

## 2019-08-01 ENCOUNTER — Encounter: Payer: Self-pay | Admitting: Family Medicine

## 2019-08-01 VITALS — BP 120/70 | HR 68 | Temp 96.9°F | Resp 14 | Wt 127.4 lb

## 2019-08-01 DIAGNOSIS — R102 Pelvic and perineal pain: Secondary | ICD-10-CM | POA: Diagnosis not present

## 2019-08-01 DIAGNOSIS — R1030 Lower abdominal pain, unspecified: Secondary | ICD-10-CM | POA: Diagnosis not present

## 2019-08-01 LAB — POCT URINALYSIS DIPSTICK
Bilirubin, UA: NEGATIVE
Blood, UA: NEGATIVE
Glucose, UA: NEGATIVE
Ketones, UA: NEGATIVE
Leukocytes, UA: NEGATIVE
Nitrite, UA: NEGATIVE
Protein, UA: NEGATIVE
Spec Grav, UA: 1.01 (ref 1.010–1.025)
Urobilinogen, UA: 0.2 E.U./dL
pH, UA: 5 (ref 5.0–8.0)

## 2019-08-01 NOTE — Progress Notes (Signed)
Patient: Crystal Higgins Female    DOB: 1961-10-09   58 y.o.   MRN: [id number] Visit Date: 08/01/2019  Today's Provider: Lelon Huh, MD   Chief Complaint  Patient presents with  . Abdominal Pain   Subjective:     Abdominal Pain This is a new problem. The current episode started in the past 7 days. The onset quality is sudden. The problem occurs intermittently. The problem has been waxing and waning. The pain is located in the suprapubic region. The pain is moderate. The quality of the pain is aching. The abdominal pain does not radiate. Associated symptoms include frequency. Pertinent negatives include no anorexia, arthralgias, belching, constipation, diarrhea, dysuria, fever, flatus, headaches, hematochezia, hematuria, melena, myalgias, nausea, vomiting or weight loss. The pain is aggravated by movement (patient reports pain is triggered when she walks). The pain is relieved by nothing. She has tried nothing for the symptoms. Her past medical history is significant for abdominal surgery (hysyterectomy). There is no history of colon cancer, Crohn's disease, gallstones, GERD, irritable bowel syndrome, pancreatitis, PUD or ulcerative colitis.  she has had symptoms off and on for 2-3 months and at one time had small amount of blood in urine. She thinks it resolved when she took antibiotic for ear infections, but sx returned last week. She states pain feels just like previous UTI symptoms she had several years. She has not noticed any bulging or swelling in abdomen. She point to suprapubic area as local point of symptoms.   Allergies  Allergen Reactions  . Amitriptyline   . Codeine Nausea And Vomiting  . Doxycycline Nausea Only  . Nortriptyline   . Penicillins Other (See Comments)    Heart failure @ 58 months old  . Levaquin [Levofloxacin] Itching and Other (See Comments)    Flushing, Burning     Current Outpatient Medications:  .  butalbital-acetaminophen-caffeine (FIORICET)  50-325-40 MG tablet, TAKE 1 TO 2 TABLETS BY MOUTH EVERY 6 HOURS AS NEEDED, Disp: 30 tablet, Rfl: 5 .  cholecalciferol (VITAMIN D) 1000 UNITS tablet, Take by mouth., Disp: , Rfl:  .  CIPRODEX OTIC suspension, 4 drops into left ear  twice daily, Disp: , Rfl:  .  docusate sodium (COLACE) 100 MG capsule, Take 100 mg by mouth daily., Disp: , Rfl:  .  hydrocortisone 2.5 % cream, Apply topically 2 (two) times daily., Disp: 30 g, Rfl: 0 .  Multiple Vitamin (MULTIVITAMIN WITH MINERALS) TABS tablet, Take 1 tablet by mouth daily., Disp: , Rfl:  .  omeprazole (PRILOSEC) 20 MG capsule, TAKE 1 CAPSULE BY MOUTH EVERY DAY, Disp: 90 capsule, Rfl: 4 .  polyethylene glycol powder (MIRALAX) powder, Take 1 Container by mouth once., Disp: , Rfl:  .  SUMAtriptan (IMITREX) 25 MG tablet, Take 1 tablet (25 mg total) by mouth daily as needed for migraine. May repeat in 2 hours if headache persists or recurs., Disp: 10 tablet, Rfl: 3 .  vitamin B-12 (CYANOCOBALAMIN) 500 MCG tablet, Take 500 mcg by mouth every other day., Disp: , Rfl:   Review of Systems  Constitutional: Negative for fever and weight loss.  Gastrointestinal: Positive for abdominal pain. Negative for anorexia, constipation, diarrhea, flatus, hematochezia, melena, nausea and vomiting.  Genitourinary: Positive for frequency. Negative for dysuria and hematuria.  Musculoskeletal: Negative for arthralgias and myalgias.  Neurological: Negative for headaches.    Social History   Tobacco Use  . Smoking status: Never Smoker  . Smokeless tobacco: Never Used  Substance Use  Topics  . Alcohol use: No      Objective:   BP 120/70   Pulse 68   Temp (!) 96.9 F (36.1 C) (Oral)   Resp 14   Wt 127 lb 6.4 oz (57.8 kg)   SpO2 98%   BMI 21.87 kg/m  Vitals:   08/01/19 1035  BP: 120/70  Pulse: 68  Resp: 14  Temp: (!) 96.9 F (36.1 C)  TempSrc: Oral  SpO2: 98%  Weight: 127 lb 6.4 oz (57.8 kg)  Body mass index is 21.87 kg/m.   Physical Exam  General  Appearance:    Alert, cooperative, no distress  Eyes:    PERRL, conjunctiva/corneas clear, EOM's intact       Lungs:     Clear to auscultation bilaterally, respirations unlabored  Heart:    Normal heart rate. Normal rhythm. No murmurs, rubs, or gallops.   Abdomen:   soft, nontender, nondistended or no masses.     Results for orders placed or performed in visit on 08/01/19  POCT urinalysis dipstick  Result Value Ref Range   Color, UA yellow    Clarity, UA clear    Glucose, UA Negative Negative   Bilirubin, UA negative    Ketones, UA negative    Spec Grav, UA 1.010 1.010 - 1.025   Blood, UA negative    pH, UA 5.0 5.0 - 8.0   Protein, UA Negative Negative   Urobilinogen, UA 0.2 0.2 or 1.0 E.U./dL   Nitrite, UA negative    Leukocytes, UA Negative Negative   Appearance     Odor         Assessment & Plan    1. Suprapubic pain Normal u/a, but she states sx are identical to previous UTI which resolved with antibiotics  - Urine Culture  Consider suprapubic u/s if culture is negative.     Lelon Huh, MD  [facility]

## 2019-08-01 NOTE — Patient Instructions (Signed)
.   Please review the attached list of medications and notify my office if there are any errors.   . Please bring all of your medications to every appointment so we can make sure that our medication list is the same as yours.   . It is especially important to get the annual flu vaccine this year. If you haven't had it already, please go to your pharmacy or call the office as soon as possible to schedule you flu shot.  

## 2019-08-03 ENCOUNTER — Telehealth: Payer: Self-pay

## 2019-08-03 DIAGNOSIS — R102 Pelvic and perineal pain: Secondary | ICD-10-CM

## 2019-08-03 LAB — URINE CULTURE

## 2019-08-03 NOTE — Telephone Encounter (Signed)
-----   Message from Birdie Sons, MD sent at 08/03/2019  9:09 AM EDT ----- Urine culture is negative, no sign of infection. If sx do not resolve when finished with antibiotic would recommend pelvic ultrasound.

## 2019-08-03 NOTE — Telephone Encounter (Signed)
Patient advised as below. Patient states that she hasn't been on antibiotics since July. Patient wants to know if she should have been on antibiotics after her last office visit? Or should she proceed with pelvic ultrasound? Please advise.

## 2019-08-03 NOTE — Telephone Encounter (Signed)
No need for antibiotic since the culture was negative. Should proceed with pelvic ultrasound if sx have not resolved.

## 2019-08-04 NOTE — Telephone Encounter (Signed)
Spoke with patient on the phone who states that she has been drinking cranberry juice and symptoms have resolved. Patient wishes to hold off on ultrasound for now. KW

## 2019-08-23 DIAGNOSIS — H6983 Other specified disorders of Eustachian tube, bilateral: Secondary | ICD-10-CM | POA: Diagnosis not present

## 2019-08-23 DIAGNOSIS — H9202 Otalgia, left ear: Secondary | ICD-10-CM | POA: Diagnosis not present

## 2019-09-05 ENCOUNTER — Telehealth: Payer: Self-pay

## 2019-09-05 MED ORDER — CEPHALEXIN 500 MG PO CAPS: 500.0000 mg | ORAL_CAPSULE | Freq: Four times a day (QID) | ORAL | 0 refills | Status: DC

## 2019-09-05 NOTE — Telephone Encounter (Signed)
Patient calling that she has ear issues that causes parotid gland to be swolen that Dr.Fisher is aware. She is usually treated with antibiotic and that she usually gets Azithromycin but would like Keflexto be send in his time because the can feel the swelling on the parotid gland please. Patient also requested a phone call when this is done.  Pharmacy: [facility].

## 2019-09-06 ENCOUNTER — Ambulatory Visit: Admit: 2019-09-06 | Payer: BC Managed Care – PPO | Admitting: Otolaryngology

## 2019-09-06 SURGERY — MYRINGOTOMY WITH TUBE PLACEMENT
Anesthesia: General | Laterality: Left

## 2019-09-11 ENCOUNTER — Other Ambulatory Visit: Payer: Self-pay

## 2019-09-11 ENCOUNTER — Ambulatory Visit (INDEPENDENT_AMBULATORY_CARE_PROVIDER_SITE_OTHER): Payer: BC Managed Care – PPO | Admitting: Family Medicine

## 2019-09-11 ENCOUNTER — Encounter: Payer: Self-pay | Admitting: Family Medicine

## 2019-09-11 VITALS — BP 114/80 | HR 73 | Temp 97.1°F | Resp 16 | Wt 128.0 lb

## 2019-09-11 DIAGNOSIS — R682 Dry mouth, unspecified: Secondary | ICD-10-CM

## 2019-09-11 DIAGNOSIS — B370 Candidal stomatitis: Secondary | ICD-10-CM | POA: Diagnosis not present

## 2019-09-11 MED ORDER — FIRST-DUKES MOUTHWASH MT SUSP: OROMUCOSAL | 0 refills | Status: DC

## 2019-09-11 NOTE — Progress Notes (Signed)
Patient: Crystal Higgins Female    DOB: 1961-04-16   58 y.o.   MRN: [id number] Visit Date: 09/11/2019  Today's Provider: Lelon Huh, MD   Chief Complaint  Patient presents with  . Mouth Lesions   Subjective:     Mouth Lesions  Episode onset: 2 weeks ago. The problem has been unchanged. Relieved by: peroxide rinse. The symptoms are aggravated by eating. Associated symptoms include mouth sores (with white coating over the tongue) and swollen glands. Pertinent negatives include no fever, no abdominal pain, no nausea, no vomiting, no ear pain, no rhinorrhea and no sore throat.    Allergies  Allergen Reactions  . Amitriptyline   . Codeine Nausea And Vomiting  . Doxycycline Nausea Only  . Nortriptyline   . Penicillins Other (See Comments)    Heart failure @ 70 months old  . Levaquin [Levofloxacin] Itching and Other (See Comments)    Flushing, Burning     Current Outpatient Medications:  .  butalbital-acetaminophen-caffeine (FIORICET) 50-325-40 MG tablet, TAKE 1 TO 2 TABLETS BY MOUTH EVERY 6 HOURS AS NEEDED, Disp: 30 tablet, Rfl: 5 .  cholecalciferol (VITAMIN D) 1000 UNITS tablet, Take by mouth., Disp: , Rfl:  .  CIPRODEX OTIC suspension, 4 drops into left ear  twice daily, Disp: , Rfl:  .  docusate sodium (COLACE) 100 MG capsule, Take 100 mg by mouth daily., Disp: , Rfl:  .  hydrocortisone 2.5 % cream, Apply topically 2 (two) times daily., Disp: 30 g, Rfl: 0 .  Multiple Vitamin (MULTIVITAMIN WITH MINERALS) TABS tablet, Take 1 tablet by mouth daily., Disp: , Rfl:  .  omeprazole (PRILOSEC) 20 MG capsule, TAKE 1 CAPSULE BY MOUTH EVERY DAY, Disp: 90 capsule, Rfl: 4 .  polyethylene glycol powder (MIRALAX) powder, Take 1 Container by mouth once., Disp: , Rfl:  .  SUMAtriptan (IMITREX) 25 MG tablet, Take 1 tablet (25 mg total) by mouth daily as needed for migraine. May repeat in 2 hours if headache persists or recurs., Disp: 10 tablet, Rfl: 3 .  vitamin B-12 (CYANOCOBALAMIN) 500  MCG tablet, Take 500 mcg by mouth every other day., Disp: , Rfl:  .  cephALEXin (KEFLEX) 500 MG capsule, Take 1 capsule (500 mg total) by mouth 4 (four) times daily for 10 days. (Patient not taking: Reported on 09/11/2019), Disp: 40 capsule, Rfl: 0  Review of Systems  Constitutional: Negative for appetite change, chills, fatigue and fever.  HENT: Positive for mouth sores (with white coating over the tongue). Negative for ear pain, rhinorrhea and sore throat.        Dry mouth, tongue swelling and white coating over tongue  Respiratory: Negative for chest tightness and shortness of breath.   Cardiovascular: Negative for chest pain and palpitations.  Gastrointestinal: Negative for abdominal pain, nausea and vomiting.  Neurological: Negative for dizziness and weakness.    Social History   Tobacco Use  . Smoking status: Never Smoker  . Smokeless tobacco: Never Used  Substance Use Topics  . Alcohol use: No      Objective:   Temp (!) 97.1 F (36.2 C) (Temporal)   Resp 16   Wt 128 lb (58.1 kg)   BMI 21.97 kg/m  Vitals:   09/11/19 1040  Resp: 16  Temp: (!) 97.1 F (36.2 C)  TempSrc: Temporal  Weight: 128 lb (58.1 kg)  Body mass index is 21.97 kg/m.   Physical Exam  General Appearance:    Well developed, well nourished female,  alert, cooperative, in no acute distress  HENT:   White coating across dorsum of tongu.   Eyes:    PERRL, conjunctiva/corneas clear, EOM's intact       Lungs:     Clear to auscultation bilaterally, respirations unlabored  Heart:    Normal heart rate. Normal rhythm. No murmurs, rubs, or gallops.   Neurologic:   Awake, alert, oriented x 3. No apparent focal neurological           defect.           Assessment & Plan    1. Thrush  - Diphenhyd-Hydrocort-Nystatin (FIRST-DUKES MOUTHWASH) SUSP; 10 ml swish and swallow four time daily  Dispense: 237 mL; Refill: 0  2. Dry mouth Try OTC Biotene   The entirety of the information documented in the  History of Present Illness, Review of Systems and Physical Exam were personally obtained by me. Portions of this information were initially documented by Meyer Cory, CMA and reviewed by me for thoroughness and accuracy.      Lelon Huh, MD  [facility]

## 2019-09-11 NOTE — Patient Instructions (Addendum)
.   Try OTC Biotene to help with dry mouth  . It is especially important to get the annual flu vaccine this year. If you haven't had it already, please go to your pharmacy or call the office as soon as possible to schedule you flu shot.

## 2019-09-14 ENCOUNTER — Other Ambulatory Visit: Payer: Self-pay | Admitting: Family Medicine

## 2019-09-14 NOTE — Telephone Encounter (Signed)
Pt called saying she was prescribed Miracle Mouth wash on Monday and she has been using as directed.  She says it is not helping.    She know Dr. Caryn Section is not in today but would like to talk to him or a nurse tomorrow  CB#  [phone]  Con Memos

## 2019-09-15 MED ORDER — FLUCONAZOLE 150 MG PO TABS: 150.0000 mg | ORAL_TABLET | Freq: Once | ORAL | 0 refills | Status: AC

## 2019-09-15 NOTE — Telephone Encounter (Signed)
Pt advised.  RX sent to [facility].  Thanks,   -Meliss Fleek  

## 2019-09-15 NOTE — Telephone Encounter (Signed)
We can try a dose of diflucan, but she should make an appointment to see ENT to evaluate.

## 2019-09-15 NOTE — Telephone Encounter (Signed)
Pt advised.

## 2019-09-15 NOTE — Telephone Encounter (Signed)
Pt thinks her mouth and tongue is worse today than before.  Please call pt back [phone] asap  Crystal Higgins

## 2019-09-18 DIAGNOSIS — K146 Glossodynia: Secondary | ICD-10-CM | POA: Diagnosis not present

## 2019-09-18 DIAGNOSIS — R682 Dry mouth, unspecified: Secondary | ICD-10-CM | POA: Diagnosis not present

## 2019-09-22 DIAGNOSIS — R682 Dry mouth, unspecified: Secondary | ICD-10-CM | POA: Diagnosis not present

## 2019-09-22 DIAGNOSIS — J039 Acute tonsillitis, unspecified: Secondary | ICD-10-CM | POA: Diagnosis not present

## 2019-10-03 DIAGNOSIS — H9202 Otalgia, left ear: Secondary | ICD-10-CM | POA: Diagnosis not present

## 2019-10-12 DIAGNOSIS — R070 Pain in throat: Secondary | ICD-10-CM | POA: Diagnosis not present

## 2019-10-12 DIAGNOSIS — L299 Pruritus, unspecified: Secondary | ICD-10-CM | POA: Diagnosis not present

## 2019-11-04 ENCOUNTER — Other Ambulatory Visit: Payer: Self-pay | Admitting: Family Medicine

## 2019-11-04 DIAGNOSIS — H66005 Acute suppurative otitis media without spontaneous rupture of ear drum, recurrent, left ear: Secondary | ICD-10-CM

## 2019-11-09 DIAGNOSIS — K140 Glossitis: Secondary | ICD-10-CM | POA: Diagnosis not present

## 2019-11-09 LAB — CBC AND DIFFERENTIAL
HCT: 40 (ref 36–46)
Hemoglobin: 13.6 (ref 12.0–16.0)
Neutrophils Absolute: 5
Platelets: 381 (ref 150–399)
WBC: 8.7

## 2019-11-09 LAB — IRON,TIBC AND FERRITIN PANEL
%SAT: 27
Iron: 58
TIBC: 215
UIBC: 157

## 2019-11-09 LAB — VITAMIN B12: Vitamin B-12: 1135

## 2019-11-09 LAB — CBC: RBC: 4.77 (ref 3.87–5.11)

## 2019-11-09 LAB — TSH: TSH: 2.55 (ref 0.41–5.90)

## 2019-12-12 ENCOUNTER — Telehealth (INDEPENDENT_AMBULATORY_CARE_PROVIDER_SITE_OTHER): Payer: BC Managed Care – PPO | Admitting: Family Medicine

## 2019-12-12 ENCOUNTER — Telehealth: Payer: Self-pay | Admitting: Family Medicine

## 2019-12-12 ENCOUNTER — Encounter: Payer: Self-pay | Admitting: Family Medicine

## 2019-12-12 VITALS — Temp 97.5°F

## 2019-12-12 DIAGNOSIS — R070 Pain in throat: Secondary | ICD-10-CM | POA: Diagnosis not present

## 2019-12-12 DIAGNOSIS — K146 Glossodynia: Secondary | ICD-10-CM

## 2019-12-12 MED ORDER — CEPHALEXIN 500 MG PO CAPS: 500.0000 mg | ORAL_CAPSULE | Freq: Four times a day (QID) | ORAL | 0 refills | Status: AC

## 2019-12-12 NOTE — Telephone Encounter (Signed)
Patient states she had full lab panel done by Dr. Pryor Ochoa at [facility] last month and had low iron levels. Can you please see if someone at Dr.Vaught's office can send a copy. I don't think they need an ROI since we referred her there.  Thanks.

## 2019-12-12 NOTE — Progress Notes (Signed)
Patient: Crystal Higgins Female    DOB: 02-19-1961   59 y.o.   MRN: [id number] Visit Date: 12/12/2019  Today's Provider: Lelon Huh, MD   Chief Complaint  Patient presents with  . Sore Throat    x several months   Subjective:    Virtual Visit via Video Note  I connected with Crystal Higgins on 12/12/19 at  3:00 PM EST by a video enabled telemedicine application and verified that I am speaking with the correct person using two identifiers.   I discussed the limitations of evaluation and management by telemedicine and the availability of in person appointments. The patient expressed understanding and agreed to proceed.    Sore Throat  This is a new problem. The current episode started more than 1 month ago. The problem has been rapidly worsening. Pertinent negatives include no abdominal pain, coughing, shortness of breath or vomiting. Associated symptoms comments: Coating over tongue.  Similar to symptoms in October treated with Dukes mouthwash.  Start having trouble swallowing the last 3-4 days.  Has seen allergies and Dr. Pryor Ochoa. Dr. Pryor Ochoa referred to neurology for constant mouth and tongue pain. Dr. Pryor Ochoa checked vitamin b levels by patient report. She took a round of azithromycin for ear pain about a month ago.    Her primary concern today is she feels she is getting a throat infection again and needs to be back on an antibiotic.   Allergies  Allergen Reactions  . Amitriptyline   . Codeine Nausea And Vomiting  . Doxycycline Nausea Only  . Nortriptyline   . Penicillins Other (See Comments)    Heart failure @ 69 months old  . Levaquin [Levofloxacin] Itching and Other (See Comments)    Flushing, Burning     Current Outpatient Medications:  .  butalbital-acetaminophen-caffeine (FIORICET) 50-325-40 MG tablet, TAKE 1 TO 2 TABLETS BY MOUTH EVERY 6 HOURS AS NEEDED, Disp: 30 tablet, Rfl: 5 .  cholecalciferol (VITAMIN D) 1000 UNITS tablet, Take by mouth., Disp: , Rfl:    .  Diphenhyd-Hydrocort-Nystatin (FIRST-DUKES MOUTHWASH) SUSP, 10 ml swish and swallow four time daily, Disp: 237 mL, Rfl: 0 .  docusate sodium (COLACE) 100 MG capsule, Take 100 mg by mouth daily., Disp: , Rfl:  .  hydrocortisone 2.5 % cream, Apply topically 2 (two) times daily., Disp: 30 g, Rfl: 0 .  Multiple Vitamin (MULTIVITAMIN WITH MINERALS) TABS tablet, Take 1 tablet by mouth daily., Disp: , Rfl:  .  omeprazole (PRILOSEC) 20 MG capsule, TAKE 1 CAPSULE BY MOUTH EVERY DAY, Disp: 90 capsule, Rfl: 4 .  polyethylene glycol powder (MIRALAX) powder, Take 1 Container by mouth once., Disp: , Rfl:  .  SUMAtriptan (IMITREX) 25 MG tablet, Take 1 tablet (25 mg total) by mouth daily as needed for migraine. May repeat in 2 hours if headache persists or recurs., Disp: 10 tablet, Rfl: 3 .  vitamin B-12 (CYANOCOBALAMIN) 500 MCG tablet, Take 500 mcg by mouth every other day., Disp: , Rfl:  .  CIPRODEX OTIC suspension, 4 drops into left ear  twice daily, Disp: , Rfl:   Review of Systems  Constitutional: Negative for appetite change, chills, fatigue and fever.  HENT: Positive for mouth sores and sore throat.        White coating on tongue  Respiratory: Negative for cough, chest tightness and shortness of breath.   Cardiovascular: Negative for chest pain and palpitations.  Gastrointestinal: Negative for abdominal pain, nausea and vomiting.  Neurological: Negative for  dizziness and weakness.    Social History   Tobacco Use  . Smoking status: Never Smoker  . Smokeless tobacco: Never Used  Substance Use Topics  . Alcohol use: No      Objective:   Temp (!) 97.5 F (36.4 C)  Vitals:   12/12/19 1042  Temp: (!) 97.5 F (36.4 C)  There is no height or weight on file to calculate BMI.   Physical Exam  Awake, alert, oriented x 3. In no apparent distress Unable to visualize throat on camera. No obvious throat swelling.       Assessment & Plan    1. Throat pain  - cephALEXin (KEFLEX) 500 MG  capsule; Take 1 capsule (500 mg total) by mouth 4 (four) times daily for 10 days.  Dispense: 40 capsule; Refill: 0  2. Glossodynia Get copy of labs ordered last month by Dr. Pryor Ochoa. Patient reports neurology referral is imminent.   The entirety of the information documented in the History of Present Illness, Review of Systems and Physical Exam were personally obtained by me. Portions of this information were initially documented by Meyer Cory, CMA and reviewed by me for thoroughness and accuracy.       Lelon Huh, MD  [facility]

## 2019-12-13 NOTE — Telephone Encounter (Signed)
I called Dr. Darien Ramus office and was transferred to the clinical voice message system. I left a detailed message requesting a copy of labs to be faxed to our office. Will call back and check on the status of this request if I don't receive anything by Friday 12/15/2019.

## 2019-12-14 NOTE — Telephone Encounter (Signed)
Lab results received and placed on your desk for review.

## 2019-12-15 ENCOUNTER — Encounter: Payer: Self-pay | Admitting: Family Medicine

## 2019-12-15 NOTE — Telephone Encounter (Signed)
Please let patient know all her labs were normal. She actually does not have low iron. She has slightly low Iron Binding Capacity... which is basically a measurement of how much iron her blood is capable of carrying.... but that does not mean anything if the actual iron levels are normal, which hers are.

## 2019-12-15 NOTE — Telephone Encounter (Addendum)
Called and informed patient of lab results. She gave verbal understanding.

## 2019-12-25 DIAGNOSIS — R200 Anesthesia of skin: Secondary | ICD-10-CM | POA: Diagnosis not present

## 2019-12-25 DIAGNOSIS — E559 Vitamin D deficiency, unspecified: Secondary | ICD-10-CM | POA: Diagnosis not present

## 2019-12-25 DIAGNOSIS — Z79899 Other long term (current) drug therapy: Secondary | ICD-10-CM | POA: Diagnosis not present

## 2019-12-25 DIAGNOSIS — E531 Pyridoxine deficiency: Secondary | ICD-10-CM | POA: Diagnosis not present

## 2019-12-25 DIAGNOSIS — E519 Thiamine deficiency, unspecified: Secondary | ICD-10-CM | POA: Diagnosis not present

## 2019-12-25 DIAGNOSIS — E538 Deficiency of other specified B group vitamins: Secondary | ICD-10-CM | POA: Diagnosis not present

## 2019-12-25 DIAGNOSIS — E610 Copper deficiency: Secondary | ICD-10-CM | POA: Diagnosis not present

## 2019-12-27 DIAGNOSIS — G43719 Chronic migraine without aura, intractable, without status migrainosus: Secondary | ICD-10-CM | POA: Diagnosis not present

## 2020-01-25 ENCOUNTER — Other Ambulatory Visit: Payer: Self-pay | Admitting: Family Medicine

## 2020-01-25 ENCOUNTER — Encounter: Payer: Self-pay | Admitting: Family Medicine

## 2020-01-25 DIAGNOSIS — H66005 Acute suppurative otitis media without spontaneous rupture of ear drum, recurrent, left ear: Secondary | ICD-10-CM

## 2020-01-25 NOTE — Telephone Encounter (Signed)
Requested medication (s) are due for refill today: yes  Requested medication (s) are on the active medication list: no  Last refill: 11/04/2019  Future visit scheduled:no  Notes to clinic: no assigned protocol   Requested Prescriptions  Pending Prescriptions Disp Refills   azithromycin (ZITHROMAX) 250 MG tablet [Pharmacy Med Name: AZITHROMYCIN 250 MG TAB] 6 each     Sig: TAKE 2 TABLETS BY MOUTH ON DAY 1 AND ONETABLET DAILY ON DAYS 2-5      Off-Protocol Failed - 01/25/2020  8:07 AM      Failed - Medication not assigned to a protocol, review manually.      Passed - Valid encounter within last 12 months    Recent Outpatient Visits           1 month ago Throat pain   [facility] Birdie Sons, MD   4 months ago Sawyer, Kirstie Peri, MD   5 months ago Suprapubic pain   [facility] Birdie Sons, MD   7 months ago Recurrent acute suppurative otitis media without spontaneous rupture of left tympanic membrane   [facility] Birdie Sons, MD   9 months ago Otitis externa of left ear, unspecified chronicity, unspecified type   [place], [place], [place]

## 2020-01-26 MED ORDER — AZITHROMYCIN 250 MG PO TABS: ORAL_TABLET | ORAL | 1 refills | Status: AC

## 2020-01-26 NOTE — Telephone Encounter (Signed)
Please advise 

## 2020-01-29 ENCOUNTER — Ambulatory Visit: Payer: Self-pay

## 2020-01-29 DIAGNOSIS — U071 COVID-19: Secondary | ICD-10-CM | POA: Diagnosis not present

## 2020-01-29 DIAGNOSIS — Z20822 Contact with and (suspected) exposure to covid-19: Secondary | ICD-10-CM | POA: Diagnosis not present

## 2020-01-29 NOTE — Telephone Encounter (Signed)
Pt. Reports she just had a negative COVID test, but feels "it was a false negative because I have a lt of the symptoms." States she will retest. Given [facility]'s testing site information.  Answer Assessment - Initial Assessment Questions 1. TEMPERATURE: "What is the most recent temperature?"  "How was it measured?"      Friday - 99   This morning  100.1 but normal now 2. ONSET: "When did the fever start?"      Friday 3. SYMPTOMS: "Do you have any other symptoms besides the fever?"  (e.g., colds, headache, sore throat, earache, cough, rash, diarrhea, vomiting, abdominal pain)     Left ear 4. CAUSE: If there are no symptoms, ask: "What do you think is causing the fever?"      Maybe her ear 5. CONTACTS: "Does anyone else in the family have an infection?"     No 6. TREATMENT: "What have you done so far to treat this fever?" (e.g., medications)     On Z-pak 7. IMMUNOCOMPROMISE: "Do you have of the following: diabetes, HIV positive, splenectomy, cancer chemotherapy, chronic steroid treatment, transplant patient, etc."     No 8. PREGNANCY: "Is there any chance you are pregnant?" "When was your last menstrual period?"     No 9. TRAVEL: "Have you traveled out of the country in the last month?" (e.g., travel history, exposures)     No  Protocols used: FEVER-A-AH

## 2020-01-31 ENCOUNTER — Encounter: Payer: Self-pay | Admitting: Family Medicine

## 2020-02-15 ENCOUNTER — Other Ambulatory Visit (HOSPITAL_COMMUNITY): Payer: Self-pay | Admitting: Otolaryngology

## 2020-02-15 ENCOUNTER — Other Ambulatory Visit: Payer: Self-pay | Admitting: Otolaryngology

## 2020-02-15 DIAGNOSIS — H900 Conductive hearing loss, bilateral: Secondary | ICD-10-CM | POA: Diagnosis not present

## 2020-02-15 DIAGNOSIS — H9202 Otalgia, left ear: Secondary | ICD-10-CM | POA: Diagnosis not present

## 2020-02-15 DIAGNOSIS — R42 Dizziness and giddiness: Secondary | ICD-10-CM | POA: Diagnosis not present

## 2020-02-28 ENCOUNTER — Other Ambulatory Visit: Payer: Self-pay

## 2020-02-28 ENCOUNTER — Ambulatory Visit
Admission: RE | Admit: 2020-02-28 | Discharge: 2020-02-28 | Disposition: A | Discharge status: Home / Self Care | Payer: BC Managed Care – PPO | Source: Ambulatory Visit | Attending: Otolaryngology | Admitting: Otolaryngology

## 2020-02-28 DIAGNOSIS — H9193 Unspecified hearing loss, bilateral: Secondary | ICD-10-CM | POA: Diagnosis not present

## 2020-02-28 DIAGNOSIS — R42 Dizziness and giddiness: Secondary | ICD-10-CM | POA: Insufficient documentation

## 2020-02-28 MED ORDER — GADOBUTROL 1 MMOL/ML IV SOLN
5.0000 mL | Freq: Once | INTRAVENOUS | Status: AC | PRN
  Administered 2020-02-28: 18:00:00 5 mL via INTRAVENOUS

## 2020-03-18 ENCOUNTER — Other Ambulatory Visit: Payer: Self-pay | Admitting: Obstetrics and Gynecology

## 2020-03-18 DIAGNOSIS — Z1231 Encounter for screening mammogram for malignant neoplasm of breast: Secondary | ICD-10-CM

## 2020-03-26 DIAGNOSIS — H73893 Other specified disorders of tympanic membrane, bilateral: Secondary | ICD-10-CM | POA: Diagnosis not present

## 2020-03-26 DIAGNOSIS — H9203 Otalgia, bilateral: Secondary | ICD-10-CM | POA: Diagnosis not present

## 2020-03-26 DIAGNOSIS — H6983 Other specified disorders of Eustachian tube, bilateral: Secondary | ICD-10-CM | POA: Diagnosis not present

## 2020-03-26 DIAGNOSIS — H900 Conductive hearing loss, bilateral: Secondary | ICD-10-CM | POA: Diagnosis not present

## 2020-04-03 ENCOUNTER — Ambulatory Visit: Payer: BC Managed Care – PPO | Admitting: Dermatology

## 2020-04-29 ENCOUNTER — Ambulatory Visit
Admission: RE | Admit: 2020-04-29 | Discharge: 2020-04-29 | Disposition: A | Discharge status: Home / Self Care | Payer: BC Managed Care – PPO | Source: Ambulatory Visit | Attending: Obstetrics and Gynecology | Admitting: Obstetrics and Gynecology

## 2020-04-29 DIAGNOSIS — Z1231 Encounter for screening mammogram for malignant neoplasm of breast: Secondary | ICD-10-CM | POA: Diagnosis not present

## 2020-05-02 ENCOUNTER — Other Ambulatory Visit: Payer: Self-pay | Admitting: Obstetrics and Gynecology

## 2020-05-02 DIAGNOSIS — R928 Other abnormal and inconclusive findings on diagnostic imaging of breast: Secondary | ICD-10-CM

## 2020-05-02 DIAGNOSIS — N6489 Other specified disorders of breast: Secondary | ICD-10-CM

## 2020-05-06 ENCOUNTER — Ambulatory Visit
Admission: RE | Admit: 2020-05-06 | Discharge: 2020-05-06 | Disposition: A | Discharge status: Home / Self Care | Payer: BC Managed Care – PPO | Source: Ambulatory Visit | Attending: Obstetrics and Gynecology | Admitting: Obstetrics and Gynecology

## 2020-05-06 DIAGNOSIS — R928 Other abnormal and inconclusive findings on diagnostic imaging of breast: Secondary | ICD-10-CM

## 2020-05-06 DIAGNOSIS — N6489 Other specified disorders of breast: Secondary | ICD-10-CM

## 2020-05-07 ENCOUNTER — Ambulatory Visit: Payer: BC Managed Care – PPO | Admitting: Dermatology

## 2020-05-07 ENCOUNTER — Other Ambulatory Visit: Payer: Self-pay

## 2020-05-07 DIAGNOSIS — L7211 Pilar cyst: Secondary | ICD-10-CM | POA: Diagnosis not present

## 2020-05-07 DIAGNOSIS — L821 Other seborrheic keratosis: Secondary | ICD-10-CM

## 2020-05-07 DIAGNOSIS — L820 Inflamed seborrheic keratosis: Secondary | ICD-10-CM | POA: Diagnosis not present

## 2020-05-07 DIAGNOSIS — D229 Melanocytic nevi, unspecified: Secondary | ICD-10-CM | POA: Diagnosis not present

## 2020-05-07 DIAGNOSIS — R21 Rash and other nonspecific skin eruption: Secondary | ICD-10-CM

## 2020-05-07 NOTE — Progress Notes (Signed)
   Follow-Up Visit   Subjective  Crystal Higgins is a 59 y.o. female who presents for the following: crusty spot (Right side/braline), bump (right elbow, gets "puffy" at times), bump (frontal scalp, hx of bleeding once and growing), and hx of rash (face and chest - clear today, chest was broken out 2 weeks ago. She has TMC 0.1% Cream and HC 2.5% Cream for outbreaks. She went to allergist and had allergy testing. No allergens reported).   The following portions of the chart were reviewed this encounter and updated as appropriate:      Review of Systems:  No other skin or systemic complaints except as noted in HPI or Assessment and Plan.  Objective  Well appearing patient in no apparent distress; mood and affect are within normal limits.  A focused examination was performed including chest, trunk, scalp. Relevant physical exam findings are noted in the Assessment and Plan.  Objective  Chest: Mild erythema on central upper chest.  Objective  Right Elbow: Erythematous keratotic or waxy stuck-on papule.   Objective  Scalp: Indistinct 5.0 mm firm sq nodule   Assessment & Plan   Melanocytic Nevi - Tan-brown and/or pink-flesh-colored symmetric macules and papules - L  lateral breast - Benign appearing on exam today - Observation - Call clinic for new or changing moles - Recommend daily use of broad spectrum spf 30+ sunscreen to sun-exposed areas.  Seborrheic Keratoses - Stuck-on, waxy, tan-brown papules and plaques - right flank at braline - Discussed benign etiology and prognosis. - Observe - Call for any changes   Rash Chest  Improved today- probable resolved dermatitis  Continue TMC 0.1% Cream BID to affected areas PRN rash until improved. Avoid face, groin, axilla.   Continue HC 2.5% Cream BID affected areas rash on face PRN until clear.  Topical steroids (such as triamcinolone, fluocinolone, fluocinonide, mometasone, clobetasol, halobetasol, betamethasone,  hydrocortisone) can cause thinning and lightening of the skin if they are used for too long in the same area. Your physician has selected the right strength medicine for your problem and area affected on the body. Please use your medication only as directed by your physician to prevent side effects.    Inflamed seborrheic keratosis Right Elbow  Destruction of lesion - Right Elbow  Destruction method: cryotherapy   Informed consent: discussed and consent obtained   Lesion destroyed using liquid nitrogen: Yes   Region frozen until ice ball extended beyond lesion: Yes   Outcome: patient tolerated procedure well with no complications   Post-procedure details: wound care instructions given    Pilar cyst Scalp  vs SK Asymptomatic, improved per patient Benign-appearing, observe for changes   Return if symptoms worsen or fail to improve.  IJamesetta Orleans, CMA, am acting as scribe for Brendolyn Patty, MD .  Documentation: I have reviewed the above documentation for accuracy and completeness, and I agree with the above.  Brendolyn Patty MD

## 2020-05-07 NOTE — Patient Instructions (Signed)

## 2020-06-04 NOTE — Progress Notes (Signed)
MyChart Video Visit    Virtual Visit via Video Note   This visit type was conducted due to national recommendations for restrictions regarding the COVID-19 Pandemic (e.g. social distancing) in an effort to limit this patient's exposure and mitigate transmission in our community. This patient is at least at moderate risk for complications without adequate follow up. This format is felt to be most appropriate for this patient at this time. Physical exam was limited by quality of the video and audio technology used for the visit.   Patient location: home Provider location: [facility]   Patient: Crystal Higgins   DOB: 03-28-1961   59 y.o. Female  MRN: [id number] Visit Date: 06/05/2020  Today's healthcare provider: Lelon Huh, MD   Chief Complaint  Patient presents with  . Ear Pain   Subjective    Otalgia  There is pain in the left ear. This is a recurrent problem. Episode onset: 2 weeks ago. The problem occurs constantly. The problem has been waxing and waning. There has been no fever. She has tried ear drops and NSAIDs for the symptoms. The treatment provided no relief. Her past medical history is significant for a chronic ear infection.   Pain is identical to previous recurrent ear infections for which she has been followed by Dr. Pryor Ochoa, but she is not able to get in to seem him soon.   Medications: Outpatient Medications Prior to Visit  Medication Sig  . butalbital-acetaminophen-caffeine (FIORICET) 50-325-40 MG tablet TAKE 1 TO 2 TABLETS BY MOUTH EVERY 6 HOURS AS NEEDED  . cholecalciferol (VITAMIN D) 1000 UNITS tablet Take by mouth.  Marland Kitchen CIPRODEX OTIC suspension 4 drops into left ear  twice daily  . docusate sodium (COLACE) 100 MG capsule Take 100 mg by mouth daily.  . hydrocortisone 2.5 % cream Apply topically 2 (two) times daily.  . Multiple Vitamin (MULTIVITAMIN WITH MINERALS) TABS tablet Take 1 tablet by mouth daily.  Marland Kitchen omeprazole (PRILOSEC) 20 MG capsule TAKE 1 CAPSULE BY MOUTH  EVERY DAY  . polyethylene glycol powder (MIRALAX) powder Take 1 Container by mouth once.  . SUMAtriptan (IMITREX) 25 MG tablet Take 1 tablet (25 mg total) by mouth daily as needed for migraine. May repeat in 2 hours if headache persists or recurs.  . vitamin B-12 (CYANOCOBALAMIN) 500 MCG tablet Take 500 mcg by mouth every other day.  . [DISCONTINUED] Diphenhyd-Hydrocort-Nystatin (FIRST-DUKES MOUTHWASH) SUSP 10 ml swish and swallow four time daily   No facility-administered medications prior to visit.    Review of Systems  HENT: Positive for ear pain.      Objective    BP 132/78 (BP Location: Right Arm, Patient Position: Sitting, Cuff Size: Normal)   Temp 97.8 F (36.6 C) (Temporal)   Ht 5\' 4"  (1.626 m)   BMI 21.97 kg/m   Physical Exam   Awake, alert, oriented x 3. In no apparent distress   Assessment & Plan     1. Recurrent acute suppurative otitis media without spontaneous rupture of left tympanic membrane  - cefdinir (OMNICEF) 300 MG capsule; Take 2 capsules (600 mg total) by mouth daily for 10 days.  Dispense: 20 capsule; Refill: 0  2. Other migraine without status migrainosus, not intractable She needs refill SUMAtriptan (IMITREX) 25 MG tablet; Take 1 tablet (25 mg total) by mouth daily as needed for migraine. May repeat in 2 hours if headache persists or recurs.  Dispense: 10 tablet; Refill: 3   No follow-ups on file.  Video connection was lost when more than 50% of the duration of the visit was complete, at which time the remainder of the visit was completed via audio only. I discussed the assessment and treatment plan with the patient. The patient was provided an opportunity to ask questions and all were answered. The patient agreed with the plan and demonstrated an understanding of the instructions.   The patient was advised to call back or seek an in-person evaluation if the symptoms worsen or if the condition fails to improve as anticipated.  I provided 10  minutes of non-face-to-face time during this encounter.  The entirety of the information documented in the History of Present Illness, Review of Systems and Physical Exam were personally obtained by me. Portions of this information were initially documented by the CMA and reviewed by me for thoroughness and accuracy.     Lelon Huh, MD [facility] [phone] (phone) [phone] (fax)  [place]

## 2020-06-05 ENCOUNTER — Encounter: Payer: Self-pay | Admitting: Family Medicine

## 2020-06-05 ENCOUNTER — Telehealth (INDEPENDENT_AMBULATORY_CARE_PROVIDER_SITE_OTHER): Payer: BC Managed Care – PPO | Admitting: Family Medicine

## 2020-06-05 DIAGNOSIS — H66005 Acute suppurative otitis media without spontaneous rupture of ear drum, recurrent, left ear: Secondary | ICD-10-CM

## 2020-06-05 DIAGNOSIS — G43809 Other migraine, not intractable, without status migrainosus: Secondary | ICD-10-CM | POA: Diagnosis not present

## 2020-06-05 MED ORDER — CEFDINIR 300 MG PO CAPS: 600.0000 mg | ORAL_CAPSULE | Freq: Every day | ORAL | 0 refills | Status: AC

## 2020-06-05 MED ORDER — SUMATRIPTAN SUCCINATE 25 MG PO TABS: 25.0000 mg | ORAL_TABLET | Freq: Every day | ORAL | 3 refills | Status: DC | PRN

## 2020-06-11 ENCOUNTER — Encounter: Payer: Self-pay | Admitting: Family Medicine

## 2020-06-11 ENCOUNTER — Telehealth: Payer: Self-pay | Admitting: Family Medicine

## 2020-06-11 MED ORDER — AZITHROMYCIN 250 MG PO TABS: ORAL_TABLET | ORAL | 0 refills | Status: DC

## 2020-06-11 NOTE — Telephone Encounter (Signed)
Called to inform the doctor that the medication she was prescribed is not helping her ear pain.  She stated that the doctor wanted her to let him know if it did not help.  Please advise and call to discuss at [phone]

## 2020-06-25 DIAGNOSIS — R682 Dry mouth, unspecified: Secondary | ICD-10-CM | POA: Diagnosis not present

## 2020-06-25 DIAGNOSIS — R21 Rash and other nonspecific skin eruption: Secondary | ICD-10-CM | POA: Diagnosis not present

## 2020-06-25 DIAGNOSIS — R200 Anesthesia of skin: Secondary | ICD-10-CM | POA: Diagnosis not present

## 2020-07-08 DIAGNOSIS — B349 Viral infection, unspecified: Secondary | ICD-10-CM | POA: Diagnosis not present

## 2020-07-08 DIAGNOSIS — J029 Acute pharyngitis, unspecified: Secondary | ICD-10-CM | POA: Diagnosis not present

## 2020-07-08 DIAGNOSIS — Z20822 Contact with and (suspected) exposure to covid-19: Secondary | ICD-10-CM | POA: Diagnosis not present

## 2020-08-12 ENCOUNTER — Other Ambulatory Visit: Payer: Self-pay | Admitting: Family Medicine

## 2020-08-30 ENCOUNTER — Encounter: Payer: Self-pay | Admitting: Family Medicine

## 2020-08-30 MED ORDER — AZITHROMYCIN 250 MG PO TABS: ORAL_TABLET | ORAL | 0 refills | Status: AC

## 2020-09-09 IMAGING — MG MM DIGITAL DIAGNOSTIC UNILAT*L* W/ TOMO W/ CAD
6 series · 6 of 18 positions shown · non-contrast
Comparison: Previous exam(s).

CLINICAL DATA: Patient recalled from screening for left breast
asymmetry.

EXAM:
DIGITAL DIAGNOSTIC UNILATERAL LEFT MAMMOGRAM WITH TOMO AND CAD

[L MLO synth-2D]
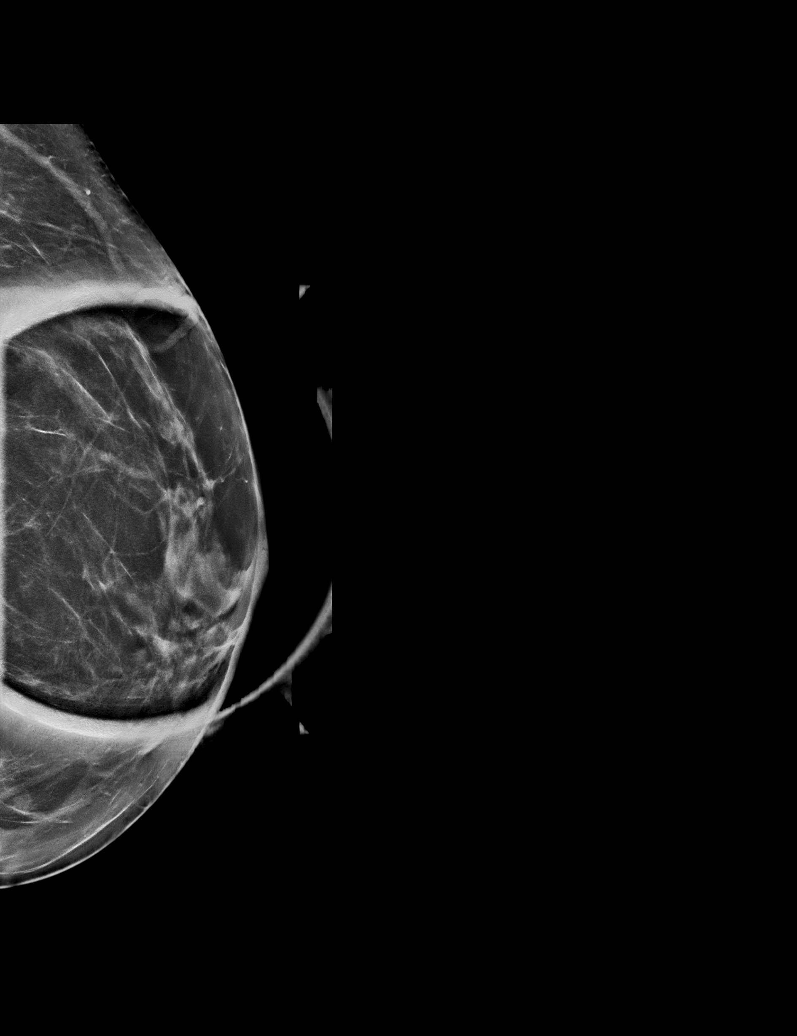

[L CC synth-2D]
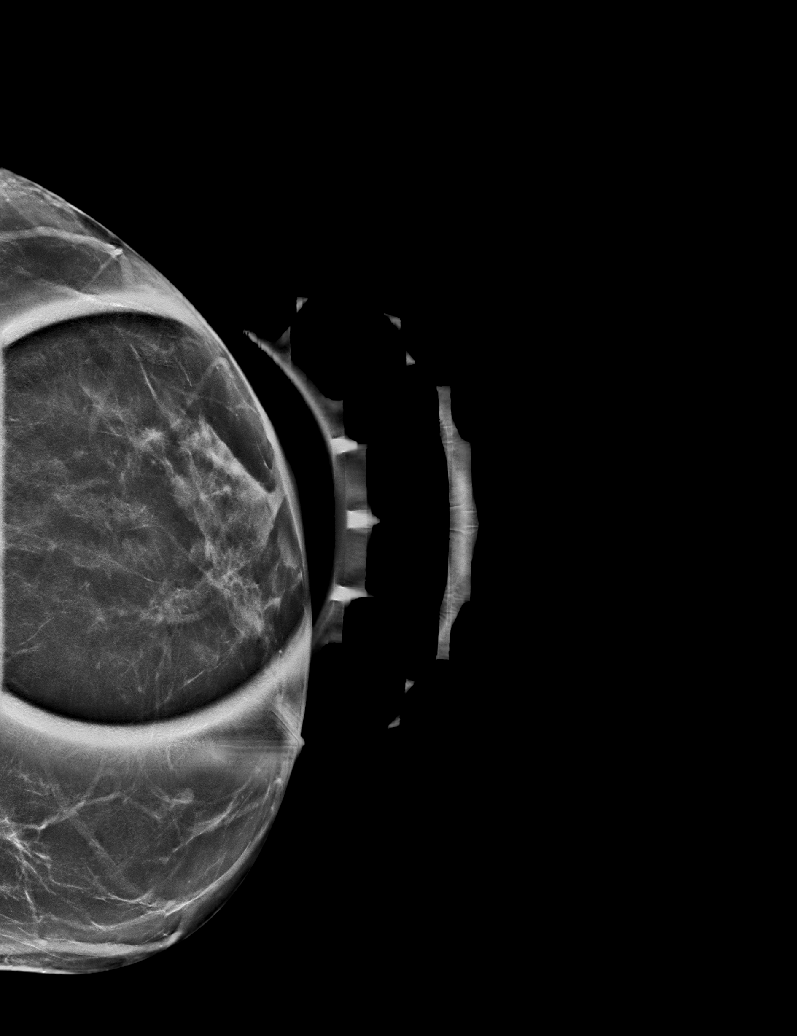

[L ML synth-2D]
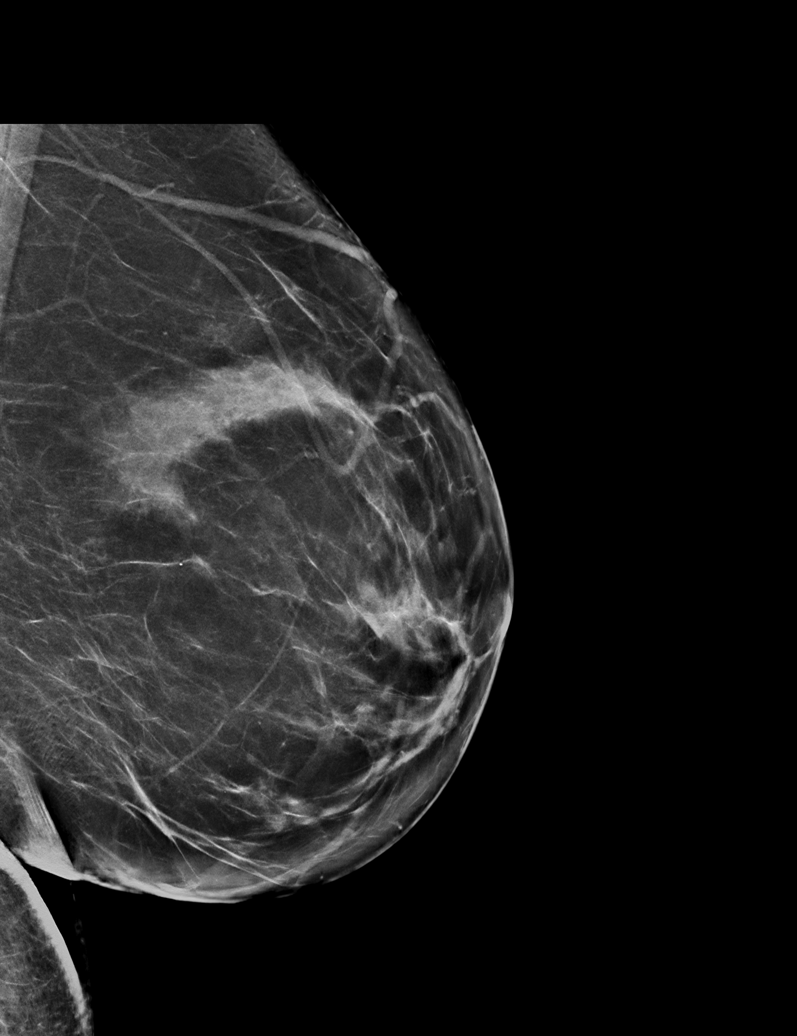

[L MLO tomo · tomo slice 35/70.0]
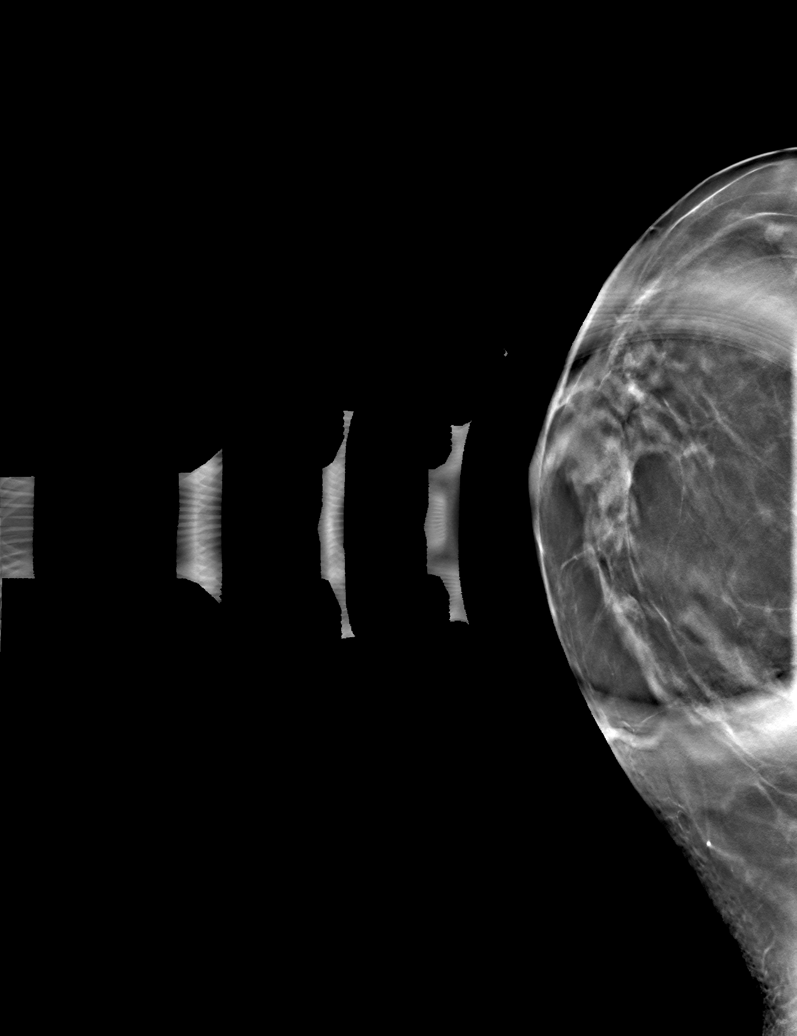

[L ML tomo · tomo slice 39/78.0]
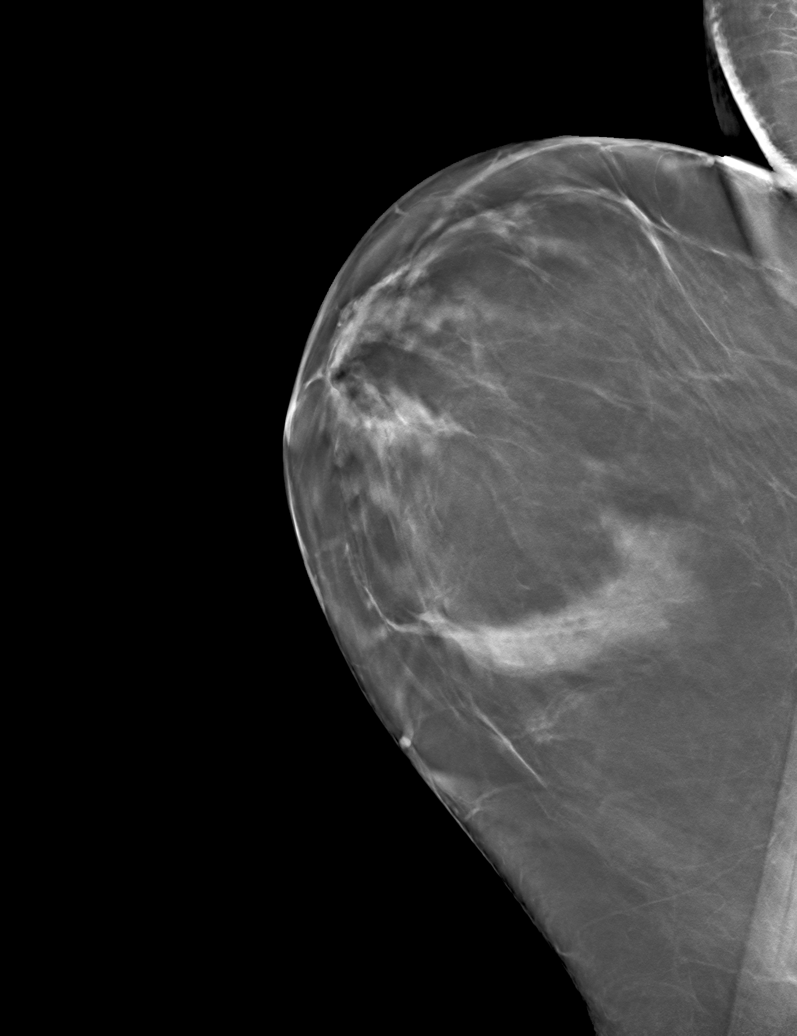

[L CC tomo · tomo slice 33/66.0]
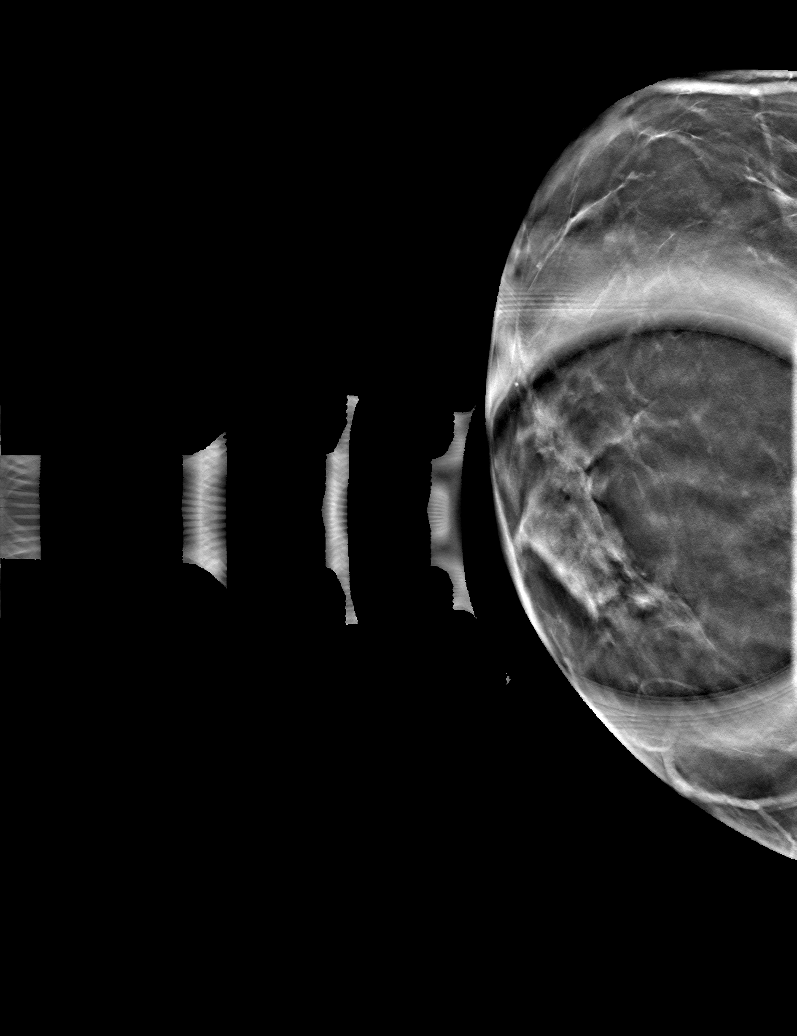

[6 of 18 positions shown; findings below may reference images not displayed]

ACR Breast Density Category b: There are scattered areas of
fibroglandular density.
FINDINGS: Additional imaging of the anterior left breast demonstrates the
questioned asymmetry to resolve with additional imaging suggestive
of dense overlapping fibroglandular tissue. No suspicious findings.

Mammographic images were processed with CAD.
IMPRESSION: No mammographic evidence for malignancy.

RECOMMENDATION:
Screening mammogram in one year.(Code:DD-P-OL0)

I have discussed the findings and recommendations with the patient.
If applicable, a reminder letter will be sent to the patient
regarding the next appointment.

BI-RADS CATEGORY  2: Benign.

## 2020-10-29 DIAGNOSIS — N898 Other specified noninflammatory disorders of vagina: Secondary | ICD-10-CM | POA: Diagnosis not present

## 2020-10-29 DIAGNOSIS — R300 Dysuria: Secondary | ICD-10-CM | POA: Diagnosis not present

## 2020-11-19 ENCOUNTER — Encounter: Payer: Self-pay | Admitting: Family Medicine

## 2020-11-19 ENCOUNTER — Telehealth: Payer: Self-pay | Admitting: Family Medicine

## 2020-11-19 MED ORDER — AZITHROMYCIN 250 MG PO TABS: ORAL_TABLET | ORAL | 0 refills | Status: DC

## 2020-11-27 ENCOUNTER — Ambulatory Visit: Payer: BC Managed Care – PPO | Admitting: Family Medicine

## 2020-12-02 ENCOUNTER — Encounter: Payer: Self-pay | Admitting: Family Medicine

## 2020-12-02 ENCOUNTER — Ambulatory Visit: Payer: BC Managed Care – PPO | Admitting: Family Medicine

## 2020-12-02 ENCOUNTER — Other Ambulatory Visit: Payer: Self-pay

## 2020-12-02 VITALS — BP 147/86 | HR 73 | Temp 98.4°F | Resp 16 | Wt 132.0 lb

## 2020-12-02 DIAGNOSIS — I10 Essential (primary) hypertension: Secondary | ICD-10-CM | POA: Diagnosis not present

## 2020-12-02 MED ORDER — AMLODIPINE BESYLATE 2.5 MG PO TABS: 2.5000 mg | ORAL_TABLET | Freq: Every day | ORAL | 1 refills | Status: DC

## 2020-12-02 NOTE — Progress Notes (Signed)
Established patient visit   Patient: Crystal Higgins   DOB: 1961/11/05   60 y.o. Female  MRN: [id number] Visit Date: 12/02/2020  Today's healthcare provider: Lelon Huh, MD   Chief Complaint  Patient presents with  . Blood Pressure Check   Subjective    HPI  Blood pressure check: Patient complains of elevated blood pressure readings for the past few months. She reports home blood pressure readings as high as 149/90. Associated symptoms includes headaches visual disturbance (seeing spots).      Medications: Outpatient Medications Prior to Visit  Medication Sig  . butalbital-acetaminophen-caffeine (FIORICET) 50-325-40 MG tablet TAKE 1-2 TABLETS BY MOUTH EVERY 6 HOURS AS NEEDED  . cholecalciferol (VITAMIN D) 1000 UNITS tablet Take by mouth.  Marland Kitchen CIPRODEX OTIC suspension 4 drops into left ear  twice daily  . docusate sodium (COLACE) 100 MG capsule Take 100 mg by mouth daily.  . hydrocortisone 2.5 % cream Apply topically 2 (two) times daily.  . Multiple Vitamin (MULTIVITAMIN WITH MINERALS) TABS tablet Take 1 tablet by mouth daily.  Marland Kitchen omeprazole (PRILOSEC) 20 MG capsule TAKE 1 CAPSULE BY MOUTH ONCE DAILY  . polyethylene glycol powder (GLYCOLAX/MIRALAX) 17 GM/SCOOP powder Take 1 Container by mouth once.  . SUMAtriptan (IMITREX) 25 MG tablet Take 1 tablet (25 mg total) by mouth daily as needed for migraine. May repeat in 2 hours if headache persists or recurs.  . vitamin B-12 (CYANOCOBALAMIN) 500 MCG tablet Take 500 mcg by mouth every other day.   No facility-administered medications prior to visit.    Review of Systems  Constitutional: Negative for appetite change, chills, fatigue and fever.  Eyes: Positive for visual disturbance.  Respiratory: Negative for chest tightness and shortness of breath.   Cardiovascular: Negative for chest pain and palpitations.  Gastrointestinal: Negative for abdominal pain, nausea and vomiting.  Neurological: Positive for headaches (squeezing  pain). Negative for dizziness and weakness.      Objective    BP (!) 147/86 (BP Location: Right Arm, Patient Position: Sitting, Cuff Size: Normal)   Pulse 73   Temp 98.4 F (36.9 C) (Oral)   Resp 16   Wt 132 lb (59.9 kg)   BMI 22.66 kg/m    Physical Exam    General: Appearance:    Well developed, well nourished female in no acute distress  Eyes:    PERRL, conjunctiva/corneas clear, EOM's intact       Lungs:     Clear to auscultation bilaterally, respirations unlabored  Heart:    Normal heart rate. Normal rhythm. No murmurs, rubs, or gallops.   MS:   All extremities are intact.   Neurologic:   Awake, alert, oriented x 3. No apparent focal neurological           defect.         Assessment & Plan     1. Primary hypertension Counseled on low sodium diet and regular exercise.  - amLODipine (NORVASC) 2.5 MG tablet; Take 1 tablet (2.5 mg total) by mouth daily.  Dispense: 30 tablet; Refill: 1   BP check in 4 weeks.       The entirety of the information documented in the History of Present Illness, Review of Systems and Physical Exam were personally obtained by me. Portions of this information were initially documented by the CMA and reviewed by me for thoroughness and accuracy.      Lelon Huh, MD  [facility] [phone] (phone) [phone] (fax)  [facility]

## 2020-12-02 NOTE — Patient Instructions (Addendum)
. Please review the attached list of medications and notify my office if there are any errors.   . Please bring all of your medications to every appointment so we can make sure that our medication list is the same as yours.    DASH Eating Plan DASH stands for Dietary Approaches to Stop Hypertension. The DASH eating plan is a healthy eating plan that has been shown to:  Reduce high blood pressure (hypertension).  Reduce your risk for type 2 diabetes, heart disease, and stroke.  Help with weight loss. What are tips for following this plan? Reading food labels  Check food labels for the amount of salt (sodium) per serving. Choose foods with less than 5 percent of the Daily Value of sodium. Generally, foods with less than 300 milligrams (mg) of sodium per serving fit into this eating plan.  To find whole grains, look for the word "whole" as the first word in the ingredient list. Shopping  Buy products labeled as "low-sodium" or "no salt added."  Buy fresh foods. Avoid canned foods and pre-made or frozen meals. Cooking  Avoid adding salt when cooking. Use salt-free seasonings or herbs instead of table salt or sea salt. Check with your health care provider or pharmacist before using salt substitutes.  Do not fry foods. Cook foods using healthy methods such as baking, boiling, grilling, roasting, and broiling instead.  Cook with heart-healthy oils, such as olive, canola, avocado, soybean, or sunflower oil. Meal planning  Eat a balanced diet that includes: ? 4 or more servings of fruits and 4 or more servings of vegetables each day. Try to fill one-half of your plate with fruits and vegetables. ? 6-8 servings of whole grains each day. ? Less than 6 oz (170 g) of lean meat, poultry, or fish each day. A 3-oz (85-g) serving of meat is about the same size as a deck of cards. One egg equals 1 oz (28 g). ? 2-3 servings of low-fat dairy each day. One serving is 1 cup (237 mL). ? 1 serving of  nuts, seeds, or beans 5 times each week. ? 2-3 servings of heart-healthy fats. Healthy fats called omega-3 fatty acids are found in foods such as walnuts, flaxseeds, fortified milks, and eggs. These fats are also found in cold-water fish, such as sardines, salmon, and mackerel.  Limit how much you eat of: ? Canned or prepackaged foods. ? Food that is high in trans fat, such as some fried foods. ? Food that is high in saturated fat, such as fatty meat. ? Desserts and other sweets, sugary drinks, and other foods with added sugar. ? Full-fat dairy products.  Do not salt foods before eating.  Do not eat more than 4 egg yolks a week.  Try to eat at least 2 vegetarian meals a week.  Eat more home-cooked food and less restaurant, buffet, and fast food.   Lifestyle  When eating at a restaurant, ask that your food be prepared with less salt or no salt, if possible.  If you drink alcohol: ? Limit how much you use to:  0-1 drink a day for women who are not pregnant.  0-2 drinks a day for men. ? Be aware of how much alcohol is in your drink. In the [place] one drink equals one 12 oz bottle of beer (355 mL), one 5 oz glass of wine (148 mL), or one 1 oz glass of hard liquor (44 mL). General information  Avoid eating more than 2,300 mg of  salt a day. If you have hypertension, you may need to reduce your sodium intake to 1,500 mg a day.  Work with your health care provider to maintain a healthy body weight or to lose weight. Ask what an ideal weight is for you.  Get at least 30 minutes of exercise that causes your heart to beat faster (aerobic exercise) most days of the week. Activities may include walking, swimming, or biking.  Work with your health care provider or dietitian to adjust your eating plan to your individual calorie needs. What foods should I eat? Fruits All fresh, dried, or frozen fruit. Canned fruit in natural juice (without added sugar). Vegetables Fresh or frozen vegetables  (raw, steamed, roasted, or grilled). Low-sodium or reduced-sodium tomato and vegetable juice. Low-sodium or reduced-sodium tomato sauce and tomato paste. Low-sodium or reduced-sodium canned vegetables. Grains Whole-grain or whole-wheat bread. Whole-grain or whole-wheat pasta. Brown rice. [place]. Bulgur. Whole-grain and low-sodium cereals. Pita bread. Low-fat, low-sodium crackers. Whole-wheat flour tortillas. Meats and other proteins Skinless chicken or [place]. Ground chicken or [place]. Pork with fat trimmed off. Fish and seafood. Egg whites. Dried beans, peas, or lentils. Unsalted nuts, nut butters, and seeds. Unsalted canned beans. Lean cuts of beef with fat trimmed off. Low-sodium, lean precooked or cured meat, such as sausages or meat loaves. Dairy Low-fat (1%) or fat-free (skim) milk. Reduced-fat, low-fat, or fat-free cheeses. Nonfat, low-sodium ricotta or cottage cheese. Low-fat or nonfat yogurt. Low-fat, low-sodium cheese. Fats and oils Soft margarine without trans fats. Vegetable oil. Reduced-fat, low-fat, or light mayonnaise and salad dressings (reduced-sodium). Canola, safflower, olive, avocado, soybean, and sunflower oils. Avocado. Seasonings and condiments Herbs. Spices. Seasoning mixes without salt. Other foods Unsalted popcorn and pretzels. Fat-free sweets. The items listed above may not be a complete list of foods and beverages you can eat. Contact a dietitian for more information. What foods should I avoid? Fruits Canned fruit in a light or heavy syrup. Fried fruit. Fruit in cream or butter sauce. Vegetables Creamed or fried vegetables. Vegetables in a cheese sauce. Regular canned vegetables (not low-sodium or reduced-sodium). Regular canned tomato sauce and paste (not low-sodium or reduced-sodium). Regular tomato and vegetable juice (not low-sodium or reduced-sodium). Angie Fava. Olives. Grains Baked goods made with fat, such as croissants, muffins, or some breads. Dry pasta  or rice meal packs. Meats and other proteins Fatty cuts of meat. Ribs. Fried meat. Berniece Salines. Bologna, salami, and other precooked or cured meats, such as sausages or meat loaves. Fat from the back of a pig (fatback). Bratwurst. Salted nuts and seeds. Canned beans with added salt. Canned or smoked fish. Whole eggs or egg yolks. Chicken or [place] with skin. Dairy Whole or 2% milk, cream, and half-and-half. Whole or full-fat cream cheese. Whole-fat or sweetened yogurt. Full-fat cheese. Nondairy creamers. Whipped toppings. Processed cheese and cheese spreads. Fats and oils Butter. Stick margarine. Lard. Shortening. Ghee. Bacon fat. Tropical oils, such as coconut, palm kernel, or palm oil. Seasonings and condiments Onion salt, garlic salt, seasoned salt, table salt, and sea salt. Worcestershire sauce. Tartar sauce. Barbecue sauce. Teriyaki sauce. Soy sauce, including reduced-sodium. Steak sauce. Canned and packaged gravies. Fish sauce. Oyster sauce. Cocktail sauce. Store-bought horseradish. Ketchup. Mustard. Meat flavorings and tenderizers. Bouillon cubes. Hot sauces. Pre-made or packaged marinades. Pre-made or packaged taco seasonings. Relishes. Regular salad dressings. Other foods Salted popcorn and pretzels. The items listed above may not be a complete list of foods and beverages you should avoid. Contact a dietitian for more information. Where to  find more information  National Heart, Lung, and Blood Institute: https://wilson-eaton.com/  American Heart Association: www.heart.org  Academy of Nutrition and Dietetics: www.eatright.[place]: www.kidney.org Summary  The DASH eating plan is a healthy eating plan that has been shown to reduce high blood pressure (hypertension). It may also reduce your risk for type 2 diabetes, heart disease, and stroke.  When on the DASH eating plan, aim to eat more fresh fruits and vegetables, whole grains, lean proteins, low-fat dairy, and  heart-healthy fats.  With the DASH eating plan, you should limit salt (sodium) intake to 2,300 mg a day. If you have hypertension, you may need to reduce your sodium intake to 1,500 mg a day.  Work with your health care provider or dietitian to adjust your eating plan to your individual calorie needs. This information is not intended to replace advice given to you by your health care provider. Make sure you discuss any questions you have with your health care provider. Document Revised: 10/13/2019 Document Reviewed: 10/13/2019 Elsevier Patient Education  2021 [employer].

## 2020-12-31 ENCOUNTER — Ambulatory Visit: Payer: BC Managed Care – PPO | Admitting: Family Medicine

## 2020-12-31 ENCOUNTER — Encounter: Payer: Self-pay | Admitting: Family Medicine

## 2020-12-31 ENCOUNTER — Other Ambulatory Visit: Payer: Self-pay

## 2020-12-31 DIAGNOSIS — I10 Essential (primary) hypertension: Secondary | ICD-10-CM | POA: Diagnosis not present

## 2020-12-31 MED ORDER — AMLODIPINE BESYLATE 2.5 MG PO TABS: 2.5000 mg | ORAL_TABLET | Freq: Every day | ORAL | 1 refills | Status: DC

## 2020-12-31 NOTE — Progress Notes (Signed)
Established patient visit   Patient: Crystal Higgins   DOB: 03-27-1961   60 y.o. Female  MRN: [id number] Visit Date: 12/31/2020  Today's healthcare provider: Lelon Huh, MD   Chief Complaint  Patient presents with  . Hypertension   Subjective    HPI  Hypertension, follow-up  BP Readings from Last 3 Encounters:  12/31/20 127/81  12/02/20 (!) 147/86  06/05/20 132/78   Wt Readings from Last 3 Encounters:  12/31/20 131 lb 9.6 oz (59.7 kg)  12/02/20 132 lb (59.9 kg)  09/11/19 128 lb (58.1 kg)     She was last seen for hypertension 1 months ago.  BP at that visit was 147/86. Management since that visit includes starting amLODipine (NORVASC) 2.5 MG tablet; Take 1 tablet (2.5 mg total). Patient was also counseled on low sodium diet and regular exercise.  She reports good compliance with treatment. She is not having side effects.  She is following a Regular diet. She is not exercising. She does not smoke.  Use of agents associated with hypertension: none.   Outside blood pressures are 135-150/ 80-90. Symptoms: No chest pain No chest pressure  No palpitations No syncope  No dyspnea No orthopnea  No paroxysmal nocturnal dyspnea No lower extremity edema   Pertinent labs: No results found for: CHOL, HDL, LDLCALC, LDLDIRECT, TRIG, CHOLHDL Lab Results  Component Value Date   NA 140 05/01/2015   K 3.8 05/01/2015   CREATININE 0.65 05/01/2015   GFRNONAA >60 05/01/2015   GFRAA >60 05/01/2015   GLUCOSE 74 05/01/2015     The ASCVD Risk score (Goff DC Jr., et al., 2013) failed to calculate for the following reasons:   Cannot find a previous HDL lab   Cannot find a previous total cholesterol lab   ---------------------------------------------------------------------------------------------------      Medications: Outpatient Medications Prior to Visit  Medication Sig  . amLODipine (NORVASC) 2.5 MG tablet Take 1 tablet (2.5 mg total) by mouth daily.  .  butalbital-acetaminophen-caffeine (FIORICET) 50-325-40 MG tablet TAKE 1-2 TABLETS BY MOUTH EVERY 6 HOURS AS NEEDED  . cholecalciferol (VITAMIN D) 1000 UNITS tablet Take by mouth.  Marland Kitchen CIPRODEX OTIC suspension 4 drops into left ear  twice daily  . docusate sodium (COLACE) 100 MG capsule Take 100 mg by mouth daily.  . hydrocortisone 2.5 % cream Apply topically 2 (two) times daily.  . Multiple Vitamin (MULTIVITAMIN WITH MINERALS) TABS tablet Take 1 tablet by mouth daily.  Marland Kitchen omeprazole (PRILOSEC) 20 MG capsule TAKE 1 CAPSULE BY MOUTH ONCE DAILY  . polyethylene glycol powder (GLYCOLAX/MIRALAX) 17 GM/SCOOP powder Take 1 Container by mouth once.  . SUMAtriptan (IMITREX) 25 MG tablet Take 1 tablet (25 mg total) by mouth daily as needed for migraine. May repeat in 2 hours if headache persists or recurs.  . vitamin B-12 (CYANOCOBALAMIN) 500 MCG tablet Take 500 mcg by mouth every other day.   No facility-administered medications prior to visit.    Review of Systems  Constitutional: Negative for appetite change, chills, fatigue and fever.  Respiratory: Negative for chest tightness and shortness of breath.   Cardiovascular: Negative for chest pain and palpitations.  Gastrointestinal: Positive for abdominal pain (epigastric region started 2 weeks ago). Negative for nausea and vomiting.  Neurological: Negative for dizziness and weakness.       Objective    BP 127/81 (BP Location: Left Arm, Patient Position: Sitting, Cuff Size: Normal)   Pulse 74   Temp 97.9 F (36.6 C) (Temporal)  Resp 16   Wt 131 lb 9.6 oz (59.7 kg)   BMI 22.59 kg/m     Physical Exam   General appearance: Well developed, well nourished female, cooperative and in no acute distress Head: Normocephalic, without obvious abnormality, atraumatic Respiratory: Respirations even and unlabored, normal respiratory rate Extremities: All extremities are intact.  Skin: Skin color, texture, turgor normal. No rashes seen  Psych:  Appropriate mood and affect. Neurologic: Mental status: Alert, oriented to person, place, and time, thought content appropriate.     Assessment & Plan     1. Primary hypertension Much better with addition of - amLODipine (NORVASC) 2.5 MG tablet; Take 1 tablet (2.5 mg total) by mouth daily.  Dispense: 90 tablet; Refill: 1  Is consuming healthy diet and stays physically active.  - Lipid panel - Renal function panel   No follow-ups on file.         Lelon Huh, MD  [facility] [phone] (phone) [phone] (fax)  [place]

## 2021-01-01 LAB — LIPID PANEL
Chol/HDL Ratio: 4.4 ratio (ref 0.0–4.4)
Cholesterol, Total: 218 mg/dL — ABNORMAL HIGH (ref 100–199)
HDL: 50 mg/dL (ref >39)
LDL Chol Calc (NIH): 129 mg/dL — ABNORMAL HIGH (ref 0–99)
Triglycerides: 221 mg/dL — ABNORMAL HIGH (ref 0–149)
VLDL Cholesterol Cal: 39 mg/dL (ref 5–40)

## 2021-01-01 LAB — RENAL FUNCTION PANEL
Albumin: 4.7 g/dL (ref 3.8–4.9)
BUN/Creatinine Ratio: 18 (ref 9–23)
BUN: 13 mg/dL (ref 6–24)
CO2: 24 mmol/L (ref 20–29)
Calcium: 9.7 mg/dL (ref 8.7–10.2)
Chloride: 103 mmol/L (ref 96–106)
Creatinine, Ser: 0.74 mg/dL (ref 0.57–1.00)
GFR calc Af Amer: 103 mL/min/{1.73_m2} (ref >59)
GFR calc non Af Amer: 89 mL/min/{1.73_m2} (ref >59)
Glucose: 84 mg/dL (ref 65–99)
Phosphorus: 3.8 mg/dL (ref 3.0–4.3)
Potassium: 4 mmol/L (ref 3.5–5.2)
Sodium: 142 mmol/L (ref 134–144)

## 2021-01-14 ENCOUNTER — Encounter: Payer: Self-pay | Admitting: Family Medicine

## 2021-01-14 ENCOUNTER — Telehealth: Payer: Self-pay

## 2021-01-14 DIAGNOSIS — I10 Essential (primary) hypertension: Secondary | ICD-10-CM

## 2021-01-14 NOTE — Telephone Encounter (Signed)
[facility] faxed refill request for the following medications:  90 day supply amLODipine (NORVASC) 2.5 MG tablet   Please advise. Thanks, [employer]

## 2021-01-15 ENCOUNTER — Other Ambulatory Visit: Payer: Self-pay

## 2021-01-15 MED ORDER — AMLODIPINE BESYLATE 2.5 MG PO TABS: 2.5000 mg | ORAL_TABLET | Freq: Every day | ORAL | 1 refills | Status: DC

## 2021-01-15 MED ORDER — OMEPRAZOLE 20 MG PO CPDR: 20.0000 mg | DELAYED_RELEASE_CAPSULE | Freq: Every day | ORAL | 4 refills | Status: DC

## 2021-01-15 NOTE — Telephone Encounter (Signed)
Rx sent 

## 2021-01-16 DIAGNOSIS — D8989 Other specified disorders involving the immune mechanism, not elsewhere classified: Secondary | ICD-10-CM | POA: Diagnosis not present

## 2021-01-16 DIAGNOSIS — R768 Other specified abnormal immunological findings in serum: Secondary | ICD-10-CM | POA: Diagnosis not present

## 2021-01-16 DIAGNOSIS — R200 Anesthesia of skin: Secondary | ICD-10-CM | POA: Diagnosis not present

## 2021-01-16 DIAGNOSIS — R682 Dry mouth, unspecified: Secondary | ICD-10-CM | POA: Diagnosis not present

## 2021-01-23 ENCOUNTER — Encounter: Payer: Self-pay | Admitting: Family Medicine

## 2021-01-24 MED ORDER — AZITHROMYCIN 250 MG PO TABS: ORAL_TABLET | ORAL | 0 refills | Status: AC

## 2021-03-18 DIAGNOSIS — Z1231 Encounter for screening mammogram for malignant neoplasm of breast: Secondary | ICD-10-CM | POA: Diagnosis not present

## 2021-03-18 DIAGNOSIS — Z1211 Encounter for screening for malignant neoplasm of colon: Secondary | ICD-10-CM | POA: Diagnosis not present

## 2021-03-18 DIAGNOSIS — Z1331 Encounter for screening for depression: Secondary | ICD-10-CM | POA: Diagnosis not present

## 2021-03-18 DIAGNOSIS — Z1272 Encounter for screening for malignant neoplasm of vagina: Secondary | ICD-10-CM | POA: Diagnosis not present

## 2021-03-18 DIAGNOSIS — Z01419 Encounter for gynecological examination (general) (routine) without abnormal findings: Secondary | ICD-10-CM | POA: Diagnosis not present

## 2021-03-18 LAB — HM PAP SMEAR: HM Pap smear: NEGATIVE

## 2021-03-18 LAB — RESULTS CONSOLE HPV: CHL HPV: NEGATIVE

## 2021-03-19 ENCOUNTER — Other Ambulatory Visit: Payer: Self-pay | Admitting: Obstetrics and Gynecology

## 2021-03-19 DIAGNOSIS — Z1231 Encounter for screening mammogram for malignant neoplasm of breast: Secondary | ICD-10-CM

## 2021-03-25 DIAGNOSIS — Z1211 Encounter for screening for malignant neoplasm of colon: Secondary | ICD-10-CM | POA: Diagnosis not present

## 2021-04-01 ENCOUNTER — Telehealth: Payer: Self-pay

## 2021-04-01 MED ORDER — AZITHROMYCIN 250 MG PO TABS: ORAL_TABLET | ORAL | 0 refills | Status: AC

## 2021-04-01 NOTE — Addendum Note (Signed)
Addended by: Lelon Huh E on: 04/01/2021 02:05 PM   Modules accepted: Orders

## 2021-04-01 NOTE — Telephone Encounter (Signed)
Copied from [place] [phone]. Topic: General - Call Back - No Documentation >> Apr 01, 2021  8:39 AM Loma [place] wrote: Reason for WLN:LGXQ pt has called in stating has a bad sore throat and over 1 wk and her ear is really bothering her. Pt was told that Dr Caryn Section does not have anything till nxt week /requires appt for antibodic, normally and offered a [facility]-visit or UC info, pt insist that Dr Caryn Section knows her history, just calls in a Z-pak if nothing available. Pt insisting that he be told she is having problems and he will take care of it for her. FU at [phone]

## 2021-04-14 ENCOUNTER — Telehealth (INDEPENDENT_AMBULATORY_CARE_PROVIDER_SITE_OTHER): Payer: BC Managed Care – PPO | Admitting: Family Medicine

## 2021-04-14 ENCOUNTER — Encounter: Payer: Self-pay | Admitting: Family Medicine

## 2021-04-14 DIAGNOSIS — J029 Acute pharyngitis, unspecified: Secondary | ICD-10-CM | POA: Diagnosis not present

## 2021-04-14 MED ORDER — CEPHALEXIN 500 MG PO CAPS: 500.0000 mg | ORAL_CAPSULE | Freq: Four times a day (QID) | ORAL | 0 refills | Status: DC

## 2021-04-14 MED ORDER — PREDNISONE 10 MG PO TABS: ORAL_TABLET | ORAL | 0 refills | Status: DC

## 2021-04-14 NOTE — Progress Notes (Signed)
MyChart Video Visit    Virtual Visit via Video Note   This visit type was conducted due to national recommendations for restrictions regarding the COVID-19 Pandemic (e.g. social distancing) in an effort to limit this patient's exposure and mitigate transmission in our community. This patient is at least at moderate risk for complications without adequate follow up. This format is felt to be most appropriate for this patient at this time. Physical exam was limited by quality of the video and audio technology used for the visit.   Patient location: home Provider location: [facility]  I discussed the limitations of evaluation and management by telemedicine and the availability of in person appointments. The patient expressed understanding and agreed to proceed.  Patient: Crystal Higgins   DOB: 04/09/1961   60 y.o. Female  MRN: [id number] Visit Date: 04/14/2021  Today's healthcare provider: Lelon Huh, MD   Chief Complaint  Patient presents with  . Sore Throat   Subjective    Sore Throat  This is a new problem. Episode onset: 7 days ago. The problem has been gradually worsening. The pain is worse on the left side. Associated symptoms include abdominal pain, coughing (dry), ear pain (left ear), a hoarse voice, swollen glands and trouble swallowing. Pertinent negatives include no headaches, shortness of breath or vomiting. She has had no exposure to strep or mono. She has tried NSAIDs for the symptoms. The treatment provided mild relief.   No Covid tests done. States has had similar sx in the past for which she was referred to Dr. Pryor Ochoa and treated for tonsillitis with prednisone and antibiotic.     Medications: Outpatient Medications Prior to Visit  Medication Sig  . amLODipine (NORVASC) 2.5 MG tablet Take 1 tablet (2.5 mg total) by mouth daily.  . butalbital-acetaminophen-caffeine (FIORICET) 50-325-40 MG tablet TAKE 1-2 TABLETS BY MOUTH EVERY 6 HOURS AS NEEDED  . cholecalciferol  (VITAMIN D) 1000 UNITS tablet Take by mouth.  Marland Kitchen CIPRODEX OTIC suspension 4 drops into left ear  twice daily  . docusate sodium (COLACE) 100 MG capsule Take 100 mg by mouth daily.  . hydrocortisone 2.5 % cream Apply topically 2 (two) times daily.  . Multiple Vitamin (MULTIVITAMIN WITH MINERALS) TABS tablet Take 1 tablet by mouth daily.  Marland Kitchen omeprazole (PRILOSEC) 20 MG capsule Take 1 capsule (20 mg total) by mouth daily.  . polyethylene glycol powder (GLYCOLAX/MIRALAX) 17 GM/SCOOP powder Take 1 Container by mouth once.  . SUMAtriptan (IMITREX) 25 MG tablet Take 1 tablet (25 mg total) by mouth daily as needed for migraine. May repeat in 2 hours if headache persists or recurs.  . vitamin B-12 (CYANOCOBALAMIN) 500 MCG tablet Take 500 mcg by mouth every other day. (Patient not taking: Reported on 04/14/2021)   No facility-administered medications prior to visit.    Review of Systems  Constitutional: Negative for appetite change, chills, diaphoresis, fatigue and fever.  HENT: Positive for ear pain (left ear), hoarse voice, sore throat, tinnitus and trouble swallowing. Negative for rhinorrhea, sinus pressure, sinus pain and sneezing.        White Throat exudate, and throat swelling  Respiratory: Positive for cough (dry). Negative for chest tightness and shortness of breath.   Cardiovascular: Negative for chest pain and palpitations.  Gastrointestinal: Positive for abdominal pain. Negative for nausea and vomiting.  Neurological: Negative for dizziness, weakness and headaches.       Objective     Physical Exam   Awake, alert, oriented x 3. In no apparent  distress   Assessment & Plan     1. Pharyngitis, unspecified etiology.  Advised sx are c/w covid, but have been going on for a week and not candidate for oral treatments if it were. She feels sx consistent with prior episodes of tonsillitis which responded to oral steroids and antibiotic.   - cephALEXin (KEFLEX) 500 MG capsule; Take 1 capsule  (500 mg total) by mouth 4 (four) times daily for 7 days.  Dispense: 28 capsule; Refill: 0 - predniSONE (DELTASONE) 10 MG tablet; 6 tablets for 1 day, then 5 for 1 day, then 4 for 1 day, then 3 for 1 day, then 2 for 1 day then 1 for 1 day.  Dispense: 21 tablet; Refill: 0      I discussed the assessment and treatment plan with the patient. The patient was provided an opportunity to ask questions and all were answered. The patient agreed with the plan and demonstrated an understanding of the instructions.   The patient was advised to call back or seek an in-person evaluation if the symptoms worsen or if the condition fails to improve as anticipated.  I provided 11 minutes of non-face-to-face time during this encounter.    Lelon Huh, MD [facility] [phone] (phone) [phone] (fax)  [place]

## 2021-04-30 ENCOUNTER — Ambulatory Visit
Admission: RE | Admit: 2021-04-30 | Discharge: 2021-04-30 | Disposition: A | Discharge status: Home / Self Care | Payer: BC Managed Care – PPO | Source: Ambulatory Visit | Attending: Obstetrics and Gynecology | Admitting: Obstetrics and Gynecology

## 2021-04-30 DIAGNOSIS — Z1231 Encounter for screening mammogram for malignant neoplasm of breast: Secondary | ICD-10-CM | POA: Diagnosis not present

## 2021-05-29 DIAGNOSIS — Z800 Family history of malignant neoplasm of digestive organs: Secondary | ICD-10-CM | POA: Diagnosis not present

## 2021-05-29 DIAGNOSIS — Z1211 Encounter for screening for malignant neoplasm of colon: Secondary | ICD-10-CM | POA: Diagnosis not present

## 2021-05-29 DIAGNOSIS — Z01818 Encounter for other preprocedural examination: Secondary | ICD-10-CM | POA: Diagnosis not present

## 2021-06-05 ENCOUNTER — Encounter: Payer: Self-pay | Admitting: Family Medicine

## 2021-06-06 ENCOUNTER — Telehealth: Payer: Self-pay

## 2021-06-06 MED ORDER — AZITHROMYCIN 250 MG PO TABS: ORAL_TABLET | ORAL | 0 refills | Status: AC

## 2021-06-06 NOTE — Telephone Encounter (Signed)
Copied from [phone]. Topic: General - Other >> Jun 06, 2021  9:59 AM Crystal Higgins wrote: Reason for CRM: Pt called for an update on her [facility] message regarding medication for earache. Pt requests call back

## 2021-06-06 NOTE — Telephone Encounter (Signed)
Please review mychart message.

## 2021-06-09 NOTE — Telephone Encounter (Signed)
Zpack was sent to [facility] pharmacy on Friday.

## 2021-06-09 NOTE — Telephone Encounter (Signed)
Advised patient as below.  

## 2021-06-09 NOTE — Telephone Encounter (Signed)
Patient calling back in with status of call back for medication for earache. Please call back

## 2021-06-11 ENCOUNTER — Telehealth: Payer: BC Managed Care – PPO | Admitting: Family Medicine

## 2021-06-15 DIAGNOSIS — U071 COVID-19: Secondary | ICD-10-CM | POA: Diagnosis not present

## 2021-06-15 DIAGNOSIS — R070 Pain in throat: Secondary | ICD-10-CM | POA: Diagnosis not present

## 2021-06-28 ENCOUNTER — Other Ambulatory Visit: Payer: Self-pay | Admitting: Family Medicine

## 2021-06-28 DIAGNOSIS — I10 Essential (primary) hypertension: Secondary | ICD-10-CM

## 2021-06-28 NOTE — Telephone Encounter (Signed)
Requested Prescriptions  Pending Prescriptions Disp Refills  . amLODipine (NORVASC) 2.5 MG tablet [Pharmacy Med Name: AMLODIPINE BESYLATE TABS 2.'5MG'$ ] 90 tablet 0    Sig: TAKE 1 TABLET DAILY     Cardiovascular:  Calcium Channel Blockers Passed - 06/28/2021  8:09 AM      Passed - Last BP in normal range    BP Readings from Last 1 Encounters:  12/31/20 127/81         Passed - Valid encounter within last 6 months    Recent Outpatient Visits          2 months ago Pharyngitis, unspecified etiology   [facility] Birdie Sons, MD   5 months ago Primary hypertension   [facility] Birdie Sons, MD   6 months ago Primary hypertension   [facility] Birdie Sons, MD   1 year ago Recurrent acute suppurative otitis media without spontaneous rupture of left tympanic membrane   [facility] Birdie Sons, MD   1 year ago Throat pain   [facility] Birdie Sons, MD

## 2021-07-16 DIAGNOSIS — R200 Anesthesia of skin: Secondary | ICD-10-CM | POA: Diagnosis not present

## 2021-07-16 DIAGNOSIS — R768 Other specified abnormal immunological findings in serum: Secondary | ICD-10-CM | POA: Diagnosis not present

## 2021-07-16 DIAGNOSIS — R682 Dry mouth, unspecified: Secondary | ICD-10-CM | POA: Diagnosis not present

## 2021-08-07 DIAGNOSIS — Z1211 Encounter for screening for malignant neoplasm of colon: Secondary | ICD-10-CM | POA: Diagnosis not present

## 2021-08-07 DIAGNOSIS — Z800 Family history of malignant neoplasm of digestive organs: Secondary | ICD-10-CM | POA: Diagnosis not present

## 2021-08-07 LAB — HM COLONOSCOPY

## 2021-08-18 ENCOUNTER — Other Ambulatory Visit: Payer: Self-pay | Admitting: Family Medicine

## 2021-08-19 NOTE — Telephone Encounter (Signed)
Requested medications are due for refill today yes  Requested medications are on the active medication list yes  Last refill 02/19/21  Last visit Do not see this med/dx addressed in past 12 months  Future visit scheduled no  Notes to clinic This med is not delegated, do not see a pertinent visit, please assess.

## 2021-08-19 NOTE — Telephone Encounter (Signed)
LOV: 12/31/2020 NOV: None  Last Refill:  08/12/2020  #30 2 Refills.   Thanks,   -Mickel Baas

## 2021-08-28 ENCOUNTER — Ambulatory Visit: Payer: Self-pay | Admitting: *Deleted

## 2021-08-28 DIAGNOSIS — H9202 Otalgia, left ear: Secondary | ICD-10-CM | POA: Diagnosis not present

## 2021-08-28 DIAGNOSIS — R070 Pain in throat: Secondary | ICD-10-CM | POA: Diagnosis not present

## 2021-08-28 DIAGNOSIS — J029 Acute pharyngitis, unspecified: Secondary | ICD-10-CM | POA: Diagnosis not present

## 2021-08-28 DIAGNOSIS — Z20822 Contact with and (suspected) exposure to covid-19: Secondary | ICD-10-CM | POA: Diagnosis not present

## 2021-08-28 NOTE — Telephone Encounter (Signed)
Left earache for 3 days, worsened last night with low grade fever 100.5 with scanner thermometer. Mild ST today.No drainage from ear and outer ear does not hurt. Denies congestion, runny nose. Tylenol helps a little.  Stated she was diagnosed by ENT with mastoid effusion earlier this year. Is requesting antibiotic from Dr.Fisher.Says these earaches occur several times a year. Last treated for this in July. LOV-video acute visit 04/14/21 Pharmacy on file.   Answer Assessment - Initial Assessment Questions 1. LOCATION: "Which ear is involved?"     left 2. ONSET: "When did the ear start hurting"      Few days, worsened last night 3. SEVERITY: "How bad is the pain?"  (Scale 1-10; mild, moderate or severe)   - MILD (1-3): doesn't interfere with normal activities    - MODERATE (4-7): interferes with normal activities or awakens from sleep    - SEVERE (8-10): excruciating pain, unable to do any normal activities      8 with tylenol 4. URI SYMPTOMS: "Do you have a runny nose or cough?"     no 5. FEVER: "Do you have a fever?" If Yes, ask: "What is your temperature, how was it measured, and when did it start?"     Earlier this am. 100.5 scanner 6. CAUSE: "Have you been swimming recently?", "How often do you use Q-TIPS?", "Have you had any recent air travel or scuba diving?"     Uses qtips at times 7. OTHER SYMPTOMS: "Do you have any other symptoms?" (e.g., headache, stiff neck, dizziness, vomiting, runny nose, decreased hearing)     Has hearing loss in both ears-chronic 8. PREGNANCY: "Is there any chance you are pregnant?" "When was your last menstrual period?"     na  Protocols used: Bethann Punches

## 2021-08-29 ENCOUNTER — Ambulatory Visit: Payer: Self-pay | Admitting: *Deleted

## 2021-08-29 ENCOUNTER — Telehealth: Payer: BC Managed Care – PPO | Admitting: Family Medicine

## 2021-08-29 DIAGNOSIS — J029 Acute pharyngitis, unspecified: Secondary | ICD-10-CM

## 2021-08-29 DIAGNOSIS — H6690 Otitis media, unspecified, unspecified ear: Secondary | ICD-10-CM | POA: Diagnosis not present

## 2021-08-29 MED ORDER — CEPHALEXIN 500 MG PO CAPS: 500.0000 mg | ORAL_CAPSULE | Freq: Two times a day (BID) | ORAL | 0 refills | Status: AC

## 2021-08-29 NOTE — Progress Notes (Signed)
Virtual Visit Consent   Crystal Higgins, you are scheduled for a virtual visit with a [facility] provider today.     Just as with appointments in the office, your consent must be obtained to participate.  Your consent will be active for this visit and any virtual visit you may have with one of our providers in the next 365 days.     If you have a MyChart account, a copy of this consent can be sent to you electronically.  All virtual visits are billed to your insurance company just like a traditional visit in the office.    As this is a virtual visit, video technology does not allow for your provider to perform a traditional examination.  This may limit your provider's ability to fully assess your condition.  If your provider identifies any concerns that need to be evaluated in person or the need to arrange testing (such as labs, EKG, etc.), we will make arrangements to do so.     Although advances in technology are sophisticated, we cannot ensure that it will always work on either your end or our end.  If the connection with a video visit is poor, the visit may have to be switched to a telephone visit.  With either a video or telephone visit, we are not always able to ensure that we have a secure connection.     I need to obtain your verbal consent now.   Are you willing to proceed with your visit today?    Crystal Higgins has provided verbal consent on 08/29/2021 for a virtual visit (video or telephone).   Perlie Mayo, NP   Date: 08/29/2021 9:45 AM   Virtual Visit via Video Note   I, Perlie Mayo, connected with  Crystal Higgins  ([id number], 1961/06/25) on 08/29/21 at  9:45 AM EDT by a video-enabled telemedicine application and verified that I am speaking with the correct person using two identifiers.  Location: Patient: Virtual Visit Location Patient: Home Provider: Virtual Visit Location Provider: Home Office   I discussed the limitations of evaluation and management by telemedicine  and the availability of in person appointments. The patient expressed understanding and agreed to proceed.    History of Present Illness: Crystal Higgins is a 60 y.o. who identifies as a female who was assigned female at birth, and is being seen today for ear ache. PCP is on vacation. Sore throat on Wednesday was tested for COVID was negative. Started on Tuesday- with ear pain. Has a fever and started to develop white patches on throat. Has 3 grandchildren. No known sick contacts.  Denies  chills, shortness of breath, chest pain, cough.   Problems:  Patient Active Problem List   Diagnosis Date Noted   Eustachian tube disorder, left 06/21/2018   Shoulder pain 11/01/2015   Subacromial bursitis 10/11/2015   Abdominal pain, left lower quadrant 05/29/2015   Endometriosis 05/29/2015   Hemorrhoid 05/29/2015   Migraine 05/29/2015   Parotiditis 05/29/2015   Vaginal discharge 05/29/2015    Allergies:  Allergies  Allergen Reactions   Amitriptyline    Codeine Nausea And Vomiting   Doxycycline Nausea Only   Nortriptyline    Penicillins Other (See Comments)    Heart failure @ 15 months old   Levaquin [Levofloxacin] Itching and Other (See Comments)    Flushing, Burning   Medications:  Current Outpatient Medications:    amLODipine (NORVASC) 2.5 MG tablet, TAKE 1 TABLET DAILY, Disp: 90 tablet,  Rfl: 0   butalbital-acetaminophen-caffeine (FIORICET) 50-325-40 MG tablet, Take 1-2 tablets by mouth every 6 (six) hours as needed for headache. Please schedule an office visit before anymore refills., Disp: 30 tablet, Rfl: 2   cholecalciferol (VITAMIN D) 1000 UNITS tablet, Take by mouth., Disp: , Rfl:    CIPRODEX OTIC suspension, 4 drops into left ear  twice daily, Disp: , Rfl:    docusate sodium (COLACE) 100 MG capsule, Take 100 mg by mouth daily., Disp: , Rfl:    hydrocortisone 2.5 % cream, Apply topically 2 (two) times daily., Disp: 30 g, Rfl: 0   Multiple Vitamin (MULTIVITAMIN WITH MINERALS) TABS  tablet, Take 1 tablet by mouth daily., Disp: , Rfl:    omeprazole (PRILOSEC) 20 MG capsule, Take 1 capsule (20 mg total) by mouth daily., Disp: 90 capsule, Rfl: 4   polyethylene glycol powder (GLYCOLAX/MIRALAX) 17 GM/SCOOP powder, Take 1 Container by mouth once., Disp: , Rfl:    SUMAtriptan (IMITREX) 25 MG tablet, Take 1 tablet (25 mg total) by mouth daily as needed for migraine. May repeat in 2 hours if headache persists or recurs., Disp: 10 tablet, Rfl: 3   vitamin B-12 (CYANOCOBALAMIN) 500 MCG tablet, Take 500 mcg by mouth every other day. (Patient not taking: Reported on 04/14/2021), Disp: , Rfl:   Observations/Objective: Patient is well-developed, well-nourished in no acute distress.  Resting comfortably  at home.  Head is normocephalic, atraumatic.  No labored breathing.  Speech is clear and coherent with logical content.  Patient is alert and oriented at baseline.    Assessment and Plan: 1. Acute otitis media, unspecified otitis media type Eustachian tube dysfunction that promotes ear infections  High allergy to PCN Keflex use in the past.  Works well and will provided strep coverage incase she does have strep  - cephALEXin (KEFLEX) 500 MG capsule; Take 1 capsule (500 mg total) by mouth 2 (two) times daily for 10 days.  Dispense: 20 capsule; Refill: 0  2. Acute sore throat Eustachian tube dysfunction that promotes ear infections  High allergy to PCN Keflex use in the past.  Works well and will provided strep coverage incase she does have strep  - cephALEXin (KEFLEX) 500 MG capsule; Take 1 capsule (500 mg total) by mouth 2 (two) times daily for 10 days.  Dispense: 20 capsule; Refill: 0   Reviewed side effects, risks and benefits of medication.   Patient acknowledged agreement and understanding of the plan.  I discussed the assessment and treatment plan with the patient. The patient was provided an opportunity to ask questions and all were answered. The patient agreed with the  plan and demonstrated an understanding of the instructions.   The patient was advised to call back or seek an in-person evaluation if the symptoms worsen or if the condition fails to improve as anticipated.   The above assessment and management plan was discussed with the patient. The patient verbalized understanding of and has agreed to the management plan. Patient is aware to call the clinic if symptoms persist or worsen. Patient is aware when to return to the clinic for a follow-up visit. Patient educated on when it is appropriate to go to the emergency department.    Follow Up Instructions: I discussed the assessment and treatment plan with the patient. The patient was provided an opportunity to ask questions and all were answered. The patient agreed with the plan and demonstrated an understanding of the instructions.  A copy of instructions were sent to the patient  via MyChart unless otherwise noted below.    The patient was advised to call back or seek an in-person evaluation if the symptoms worsen or if the condition fails to improve as anticipated.  Time:  I spent 10 minutes with the patient via telehealth technology discussing the above problems/concerns.    Perlie Mayo, NP

## 2021-08-29 NOTE — Telephone Encounter (Signed)
Please review for Dr. Fisher.   Thanks,   -Olivianna Higley  

## 2021-08-29 NOTE — Telephone Encounter (Signed)
Pt called back in because she was triaged by one our [place] yesterday and no one from [facility] called her back.   She did go to the urgent care yesterday and they told her to follow up with her PCP.   Flu and Covid tests were negative. This morning her throat is more sore and it's swollen with white pus on her tonsils.   These symptoms were not there yesterday when seen at the urgent care. I called into [facility] and spoke with Lauren who is clinical.   They only have one provider in today and she is booked over 100%.   It was suggested she return to the urgent care since she has new symptoms.  I let pt know this and she thanked me for my help.   She did not mention if she would go to the urgent care again or not.  She laughed and said Dr. Caryn Section needs to stop going on vacation when I'm sick.    She thanked me for my help.  I also sent my notes to [facility] as requested by Mickel Baas at [facility].

## 2021-08-29 NOTE — Patient Instructions (Signed)
I appreciate the opportunity to provide you with care for your health and wellness.  Please take all of the medication, if you have any worsening symptoms you can  make an appt at any [facility]   Please continue to practice social distancing to keep you, your family, and our community safe.  If you must go out, please wear a mask and practice good handwashing.  Have a wonderful day. With Gratitude, Cherly Beach, DNP, AGNP-BC

## 2021-08-29 NOTE — Telephone Encounter (Signed)
Reason for Disposition  [1] SEVERE pain and [2] not improved 2 hours after analgesic medication (e.g., ibuprofen or acetaminophen)    Seen yesterday at urgent care but did not have pus on tonsils and swollen sore throat just the earache.  Told to follow up with PCP.  Answer Assessment - Initial Assessment Questions 1. ANTIBIOTIC: "What antibiotic are you receiving?" "How many times per day?" "Ear drops or pills"     Pt called in yesterday with this complaint and was triaged by a nurse.   No one called her back.    I'm calling back.    My throat is sore.   I'm Covid tested negative.    I have white stuff on my tonsils.   I went to the urgent care yesterday and they told me to contact my PCP.   They told me to take 600 mg of ibuprofen and see my PCP.   2. ONSET: "When was the antibiotic started?"     Not on one.   I can't swallow.   My ears are messed up.   I'm flu and Covid tests are negative.  I had a fever yesterday.    I was seen by a provider at the urgent care and she told me to contact my PCP.   They told me it was a possible virus.   My throat is swollen and sore with white stuff on my tonsils.   The white stuff started since I was at the urgent care yesterday. 3. PAIN: "How bad is the pain?"  (Scale 1-10; mild, moderate or severe)   - MILD (1-3): doesn't interfere with normal activities    - MODERATE (4-7): interferes with normal activities or awakens from sleep    - SEVERE (8-10): excruciating pain, unable to do any normal activities      Moderate pain in throat.    4. DISCHARGE: "Is there any discharge? What color is it?"      No runny nose. 5. FEVER: "Do you have a fever?" If Yes, ask: "What is your temperature, how was it measured, and when did it start?"     No fever today 6. OTHER SYMPTOMS: "Do you have any other symptoms?" (e.g., redness of ear, headache, stiff neck, sore throat)     See triage notes from yesterday.   I have a headache and have nausea too. 7. PREGNANCY: "Is there  any chance you are pregnant?" "When was your last menstrual period?"     N/A due to ago  Protocols used: Ear - Otitis Externa Follow-up Call-A-AH, [place]

## 2021-08-31 ENCOUNTER — Encounter: Payer: Self-pay | Admitting: Family Medicine

## 2021-09-01 MED ORDER — PREDNISONE 10 MG (21) PO TBPK: ORAL_TABLET | ORAL | 0 refills | Status: DC

## 2021-09-01 NOTE — Addendum Note (Signed)
Addended by: Perlie Mayo on: 09/01/2021 09:11 AM   Modules accepted: Orders

## 2021-09-20 ENCOUNTER — Other Ambulatory Visit: Payer: Self-pay | Admitting: Family Medicine

## 2021-09-20 DIAGNOSIS — I10 Essential (primary) hypertension: Secondary | ICD-10-CM

## 2021-09-20 NOTE — Telephone Encounter (Signed)
Requested Prescriptions  Pending Prescriptions Disp Refills  . amLODipine (NORVASC) 2.5 MG tablet [Pharmacy Med Name: AMLODIPINE BESYLATE TABS 2.5MG ] 30 tablet 0    Sig: TAKE 1 TABLET DAILY     Cardiovascular:  Calcium Channel Blockers Passed - 09/20/2021  8:02 AM      Passed - Last BP in normal range    BP Readings from Last 1 Encounters:  12/31/20 127/81         Passed - Valid encounter within last 6 months    Recent Outpatient Visits          5 months ago Pharyngitis, unspecified etiology   [facility] Birdie Sons, MD   8 months ago Primary hypertension   [facility] Birdie Sons, MD   9 months ago Primary hypertension   [facility] Birdie Sons, MD   1 year ago Recurrent acute suppurative otitis media without spontaneous rupture of left tympanic membrane   [facility] Birdie Sons, MD   1 year ago Throat pain   [facility] Birdie Sons, MD      Future Appointments            In 1 week Brendolyn Patty, MD [place]

## 2021-09-24 ENCOUNTER — Encounter: Payer: Self-pay | Admitting: Family Medicine

## 2021-09-24 DIAGNOSIS — I10 Essential (primary) hypertension: Secondary | ICD-10-CM

## 2021-09-24 NOTE — Telephone Encounter (Signed)
Please advise if ok to authorize 90 day supply of [facility].   Last refill: 09/20/2021, qty 30 with no refills  Last in person office visit: 12/31/2020 No future visit scheduled.

## 2021-09-25 MED ORDER — AMLODIPINE BESYLATE 2.5 MG PO TABS: 2.5000 mg | ORAL_TABLET | Freq: Every day | ORAL | 4 refills | Status: DC

## 2021-10-01 ENCOUNTER — Ambulatory Visit: Payer: BC Managed Care – PPO | Admitting: Dermatology

## 2021-10-22 ENCOUNTER — Other Ambulatory Visit: Payer: Self-pay | Admitting: Family Medicine

## 2021-10-22 DIAGNOSIS — G43809 Other migraine, not intractable, without status migrainosus: Secondary | ICD-10-CM

## 2021-10-22 DIAGNOSIS — R070 Pain in throat: Secondary | ICD-10-CM

## 2021-10-22 MED ORDER — BUTALBITAL-APAP-CAFFEINE 50-300-40 MG PO CAPS: ORAL_CAPSULE | ORAL | 2 refills | Status: DC

## 2021-10-22 NOTE — Progress Notes (Signed)
Fax from [facility] request change to Capsules. Patient complained that tablet or chalky and get stuck.

## 2021-10-23 ENCOUNTER — Encounter: Payer: Self-pay | Admitting: Family Medicine

## 2021-10-24 MED ORDER — AZITHROMYCIN 250 MG PO TABS: ORAL_TABLET | ORAL | 0 refills | Status: AC

## 2021-10-28 ENCOUNTER — Telehealth: Payer: BC Managed Care – PPO | Admitting: Family Medicine

## 2021-12-24 ENCOUNTER — Encounter: Payer: Self-pay | Admitting: Family Medicine

## 2021-12-25 ENCOUNTER — Encounter: Payer: Self-pay | Admitting: Family Medicine

## 2021-12-25 NOTE — Telephone Encounter (Signed)
Office visit scheduled for 12/26/2020 with Ria Comment.

## 2021-12-26 ENCOUNTER — Telehealth (INDEPENDENT_AMBULATORY_CARE_PROVIDER_SITE_OTHER): Payer: BC Managed Care – PPO | Admitting: Physician Assistant

## 2021-12-26 ENCOUNTER — Encounter: Payer: Self-pay | Admitting: Physician Assistant

## 2021-12-26 DIAGNOSIS — J0110 Acute frontal sinusitis, unspecified: Secondary | ICD-10-CM | POA: Diagnosis not present

## 2021-12-26 MED ORDER — AZITHROMYCIN 250 MG PO TABS: ORAL_TABLET | ORAL | 0 refills | Status: AC

## 2021-12-26 NOTE — Progress Notes (Signed)
I,Sha'taria Tyson,acting as a Education administrator for [employer], PA-C.,have documented all relevant documentation on the behalf of Mikey Kirschner, PA-C,as directed by  Mikey Kirschner, PA-C while in the presence of Mikey Kirschner, PA-C.  MyChart Video Visit    Virtual Visit via Video Note   This visit type was conducted due to national recommendations for restrictions regarding the COVID-19 Pandemic (e.g. social distancing) in an effort to limit this patient's exposure and mitigate transmission in our community. This patient is at least at moderate risk for complications without adequate follow up. This format is felt to be most appropriate for this patient at this time. Physical exam was limited by quality of the video and audio technology used for the visit.   Patient location: home Provider location: [facility]  I discussed the limitations of evaluation and management by telemedicine and the availability of in person appointments. The patient expressed understanding and agreed to proceed.  Patient: Crystal Higgins   DOB: [birth year]   61 y.o. Female  MRN: [id number] Visit Date: 12/26/2021  Today's healthcare provider: Mikey Kirschner, PA-C   Cc. Throat pain, sinus pain  Subjective    HPI   Sore Throat Patient complains of sore throat. Associated symptoms stuffy nose and cough with mucus as well as pressure behind eyes. Onset of symptoms was 5 days ago, and have been unchanged since that time. She is drinking plenty of fluids. She has not had recent close exposure to someone with proven streptococcal pharyngitis.  Ear Pain Patient complains of ear pain and possible ear infection. Symptoms include left ear pain. Onset of symptoms was 3 days ago, and have been unchanged since that time. Associated symptoms include: sore throat and runny nose and stuffiness behind eyes . Patient denies: achiness, chills, fever , headache, low grade fever, and non productive cough. She is drinking  plenty of fluids.  Reports granddaughter was sick about 2 weeks ago and then husband about a week ago. Reports taking dayquil, and started delsym last night. Delsym provided no relief and dayquil mild relief. Started thinking she was feeling better until last night.  Medications: Outpatient Medications Prior to Visit  Medication Sig   amLODipine (NORVASC) 2.5 MG tablet Take 1 tablet (2.5 mg total) by mouth daily.   Butalbital-APAP-Caffeine (FIORICET) 50-300-40 MG CAPS Take 1-2 tablets by mouth every 6 (six) hours as needed for headache   cholecalciferol (VITAMIN D) 1000 UNITS tablet Take by mouth.   CIPRODEX OTIC suspension 4 drops into left ear  twice daily   docusate sodium (COLACE) 100 MG capsule Take 100 mg by mouth daily.   hydrocortisone 2.5 % cream Apply topically 2 (two) times daily.   Multiple Vitamin (MULTIVITAMIN WITH MINERALS) TABS tablet Take 1 tablet by mouth daily.   omeprazole (PRILOSEC) 20 MG capsule Take 1 capsule (20 mg total) by mouth daily.   polyethylene glycol powder (GLYCOLAX/MIRALAX) 17 GM/SCOOP powder Take 1 Container by mouth once.   SUMAtriptan (IMITREX) 25 MG tablet Take 1 tablet (25 mg total) by mouth daily as needed for migraine. May repeat in 2 hours if headache persists or recurs.   pilocarpine (SALAGEN) 5 MG tablet Take 5 mg by mouth 3 (three) times daily.   predniSONE (STERAPRED UNI-PAK 21 TAB) 10 MG (21) TBPK tablet Take as directed (Patient not taking: Reported on 12/26/2021)   vitamin B-12 (CYANOCOBALAMIN) 500 MCG tablet Take 500 mcg by mouth every other day. (Patient not taking: Reported on 04/14/2021)   No facility-administered medications  prior to visit.    Review of Systems  Constitutional:  Negative for fatigue and fever.  HENT:  Positive for congestion, ear pain, postnasal drip, rhinorrhea, sinus pressure, sinus pain and sore throat.   Respiratory:  Positive for cough. Negative for shortness of breath.   Cardiovascular:  Negative for chest pain  and leg swelling.  Gastrointestinal:  Negative for abdominal pain.  Neurological:  Negative for dizziness and headaches.    Objective    There were no vitals taken for this visit.   Physical Exam Constitutional:      General: She is not in acute distress. HENT:     Nose: Congestion present.  Neurological:     Mental Status: She is oriented to person, place, and time.  Psychiatric:        Behavior: Behavior normal.       Assessment & Plan     Acute sinusitis Rx azithro, allergy to PCN and doxy Encouraged fluids, continue dayquil, if not taking dayquil can take antihistamine ie zyrtec or claritin to help dry up mucous.  Return if symptoms worsen or fail to improve.    I discussed the assessment and treatment plan with the patient. The patient was provided an opportunity to ask questions and all were answered. The patient agreed with the plan and demonstrated an understanding of the instructions.   The patient was advised to call back or seek an in-person evaluation if the symptoms worsen or if the condition fails to improve as anticipated.  I provided 10 minutes of non-face-to-face time during this encounter.  I, Mikey Kirschner, PA-C have reviewed all documentation for this visit. The documentation on  12/26/2021 for the exam, diagnosis, procedures, and orders are all accurate and complete.   Mikey Kirschner, PA-C [facility] [phone] (phone) [phone] (fax)  [place]

## 2022-01-12 ENCOUNTER — Ambulatory Visit (INDEPENDENT_AMBULATORY_CARE_PROVIDER_SITE_OTHER): Payer: BC Managed Care – PPO | Admitting: Family Medicine

## 2022-01-12 ENCOUNTER — Encounter: Payer: Self-pay | Admitting: Family Medicine

## 2022-01-12 ENCOUNTER — Other Ambulatory Visit: Payer: Self-pay

## 2022-01-12 VITALS — BP 131/83 | HR 72 | Temp 97.9°F | Resp 14 | Wt 129.0 lb

## 2022-01-12 DIAGNOSIS — I10 Essential (primary) hypertension: Secondary | ICD-10-CM

## 2022-01-12 DIAGNOSIS — G43809 Other migraine, not intractable, without status migrainosus: Secondary | ICD-10-CM

## 2022-01-12 MED ORDER — NURTEC 75 MG PO TBDP: 75.0000 mg | ORAL_TABLET | ORAL | 14 refills | Status: DC

## 2022-01-12 NOTE — Progress Notes (Addendum)
I,Roshena L Chambers,acting as a scribe for Mila Merry, MD.,have documented all relevant documentation on the behalf of Mila Merry, MD,as directed by  Mila Merry, MD while in the presence of Mila Merry, MD.   Established patient visit   Patient: Crystal Higgins   DOB: 28-Dec-1960   60 y.o. Female  MRN: [id number] Visit Date: 01/12/2022  Today's healthcare provider: Mila Merry, MD   Chief Complaint  Patient presents with   Hypertension   Subjective    HPI  Hypertension, follow-up  BP Readings from Last 3 Encounters:  01/12/22 131/83  12/31/20 127/81  12/02/20 (!) 147/86   Wt Readings from Last 3 Encounters:  01/12/22 129 lb (58.5 kg)  12/31/20 131 lb 9.6 oz (59.7 kg)  12/02/20 132 lb (59.9 kg)     She was last seen for hypertension 1  year  ago.  BP at that visit was 147/86. Management since that visit includes continuing Amlodipine.  She reports good compliance with treatment. She is not having side effects.  She is following a Regular diet. She is not exercising. She does not smoke.  Use of agents associated with hypertension: none.   Outside blood pressures are checked occasionally. Symptoms: No chest pain No chest pressure  No palpitations No syncope  No dyspnea No orthopnea  No paroxysmal nocturnal dyspnea No lower extremity edema   Pertinent labs: Lab Results  Component Value Date   CHOL 218 (H) 12/31/2020   HDL 50 12/31/2020   LDLCALC 129 (H) 12/31/2020   TRIG 221 (H) 12/31/2020   CHOLHDL 4.4 12/31/2020   Lab Results  Component Value Date   NA 142 12/31/2020   K 4.0 12/31/2020   CREATININE 0.74 12/31/2020   GFRNONAA 89 12/31/2020   GLUCOSE 84 12/31/2020   TSH 2.55 11/09/2019     The 10-year ASCVD risk score (Arnett DK, et al., 2019) is: 5.3%   ---------------------------------------------------------------------------------------------------   Medications: Outpatient Medications Prior to Visit  Medication Sig    amLODipine (NORVASC) 2.5 MG tablet Take 1 tablet (2.5 mg total) by mouth daily.   Butalbital-APAP-Caffeine (FIORICET) 50-300-40 MG CAPS Take 1-2 tablets by mouth every 6 (six) hours as needed for headache   cholecalciferol (VITAMIN D) 1000 UNITS tablet Take by mouth.   CIPRODEX OTIC suspension 4 drops into left ear  twice daily   docusate sodium (COLACE) 100 MG capsule Take 100 mg by mouth daily.   hydrocortisone 2.5 % cream Apply topically 2 (two) times daily.   Multiple Vitamin (MULTIVITAMIN WITH MINERALS) TABS tablet Take 1 tablet by mouth daily.   omeprazole (PRILOSEC) 20 MG capsule Take 1 capsule (20 mg total) by mouth daily.   pilocarpine (SALAGEN) 5 MG tablet Take 5 mg by mouth 3 (three) times daily.   polyethylene glycol powder (GLYCOLAX/MIRALAX) 17 GM/SCOOP powder Take 1 Container by mouth once.   predniSONE (STERAPRED UNI-PAK 21 TAB) 10 MG (21) TBPK tablet Take as directed   SUMAtriptan (IMITREX) 25 MG tablet Take 1 tablet (25 mg total) by mouth daily as needed for migraine. May repeat in 2 hours if headache persists or recurs.   vitamin B-12 (CYANOCOBALAMIN) 500 MCG tablet Take 500 mcg by mouth every other day.   No facility-administered medications prior to visit.    Review of Systems  Constitutional:  Negative for appetite change, chills, fatigue and fever.  Respiratory:  Negative for chest tightness and shortness of breath.   Cardiovascular:  Negative for chest pain and palpitations.  Gastrointestinal:  Negative for abdominal pain, nausea and vomiting.  Neurological:  Positive for headaches. Negative for dizziness and weakness.      Objective    BP 131/83 (BP Location: Left Arm, Patient Position: Sitting, Cuff Size: Normal)   Pulse 72   Temp 97.9 F (36.6 C) (Oral)   Resp 14   Wt 129 lb (58.5 kg)   SpO2 100% Comment: room air  BMI 22.14 kg/m  {Show previous vital signs (optional):23777}  Physical Exam   General: Appearance:    Well developed, well nourished  female in no acute distress  Eyes:    PERRL, conjunctiva/corneas clear, EOM's intact       Lungs:     Clear to auscultation bilaterally, respirations unlabored  Heart:    Normal heart rate. Normal rhythm. No murmurs, rubs, or gallops.    MS:   All extremities are intact.    Neurologic:   Awake, alert, oriented x 3. No apparent focal neurological defect.         Assessment & Plan     1. Primary hypertension Well controlled.  Continue current medications.   - Comprehensive metabolic panel - Lipid panel  2. Other migraine without status migrainosus, not intractable She states Fioricet helps somewhat, but she still has headaches every day. Has tried several prophylactic medications in the past which she did not tolerate.   Her migraines are intermittent and severe enough that she has to take Fioricet 10-12 days every month. Her migraine headaches sometimes improve when she take Fioricet, but often times they not improve enough to allow her to complete activities of daily living. Her migraine headaches are not due to medication rebound. She is getting adequate sleep and does not consume caffeine or any other dietary substance that could cause her migraines. She has tried multiple prophylactic and abortive medications in the past including maximum safe doses of amitriptyline, rizatriptan, nortriptyline, topiramate, Midrin, Fioricet, and sumatriptan which did not adequately control her migraines.    Will try samples Rimegepant Sulfate (NURTEC) 75 MG TBDP; Take 75 mg by mouth every other day.  Dispense: 14  Reviewed HM. She states she had colonoscopy last year at [facility] done by Dr. Markham Jordan. Will send for records.      The entirety of the information documented in the History of Present Illness, Review of Systems and Physical Exam were personally obtained by me. Portions of this information were initially documented by the CMA and reviewed by me for thoroughness and accuracy.     Mila Merry,  MD  [facility] [phone] (phone) [phone] (fax)  [facility]

## 2022-01-12 NOTE — Patient Instructions (Signed)
Please review the attached list of medications and notify my office if there are any errors.   Please bring all of your medications to every appointment so we can make sure that our medication list is the same as yours.  Take one Nurtec tablet every other day for prevention, or up to one tablet a day as treatment.

## 2022-01-13 LAB — LIPID PANEL
Chol/HDL Ratio: 4.7 ratio — ABNORMAL HIGH (ref 0.0–4.4)
Cholesterol, Total: 227 mg/dL — ABNORMAL HIGH (ref 100–199)
HDL: 48 mg/dL (ref >39)
LDL Chol Calc (NIH): 142 mg/dL — ABNORMAL HIGH (ref 0–99)
Triglycerides: 203 mg/dL — ABNORMAL HIGH (ref 0–149)
VLDL Cholesterol Cal: 37 mg/dL (ref 5–40)

## 2022-01-13 LAB — COMPREHENSIVE METABOLIC PANEL
ALT: 11 IU/L (ref 0–32)
AST: 16 IU/L (ref 0–40)
Albumin/Globulin Ratio: 2.3 — ABNORMAL HIGH (ref 1.2–2.2)
Albumin: 4.6 g/dL (ref 3.8–4.9)
Alkaline Phosphatase: 77 IU/L (ref 44–121)
BUN/Creatinine Ratio: 15 (ref 12–28)
BUN: 11 mg/dL (ref 8–27)
Bilirubin Total: <0.2 mg/dL (ref 0.0–1.2)
CO2: 24 mmol/L (ref 20–29)
Calcium: 9.3 mg/dL (ref 8.7–10.3)
Chloride: 101 mmol/L (ref 96–106)
Creatinine, Ser: 0.73 mg/dL (ref 0.57–1.00)
Globulin, Total: 2 g/dL (ref 1.5–4.5)
Glucose: 85 mg/dL (ref 70–99)
Potassium: 4 mmol/L (ref 3.5–5.2)
Sodium: 141 mmol/L (ref 134–144)
Total Protein: 6.6 g/dL (ref 6.0–8.5)
eGFR: 94 mL/min/{1.73_m2} (ref >59)

## 2022-01-13 MED ORDER — NURTEC 75 MG PO TBDP: 75.0000 mg | ORAL_TABLET | ORAL | 0 refills | Status: DC

## 2022-01-14 ENCOUNTER — Telehealth: Payer: Self-pay | Admitting: Family Medicine

## 2022-01-14 DIAGNOSIS — G43809 Other migraine, not intractable, without status migrainosus: Secondary | ICD-10-CM

## 2022-01-14 NOTE — Telephone Encounter (Signed)
Please review. Prescription needs to be sent into pharmacy before PA can be initiated. Patient would like a 90 day supply sent to [facility] in [place].

## 2022-01-14 NOTE — Telephone Encounter (Signed)
Pt has samples of Nurtec OTD, she says this is working for her and she says her insurance needs a Prior Authorization for 90 day supply.  [facility] [place], [place] AT [facility] OF [address] [place] [address] Phone: [phone] Fax: [phone]

## 2022-01-16 MED ORDER — NURTEC 75 MG PO TBDP: 75.0000 mg | ORAL_TABLET | Freq: Every day | ORAL | 3 refills | Status: DC | PRN

## 2022-01-19 ENCOUNTER — Ambulatory Visit: Payer: BC Managed Care – PPO | Admitting: Family Medicine

## 2022-01-19 DIAGNOSIS — R682 Dry mouth, unspecified: Secondary | ICD-10-CM | POA: Diagnosis not present

## 2022-01-19 DIAGNOSIS — R768 Other specified abnormal immunological findings in serum: Secondary | ICD-10-CM | POA: Diagnosis not present

## 2022-01-20 ENCOUNTER — Ambulatory Visit: Payer: BC Managed Care – PPO | Admitting: Dermatology

## 2022-01-20 ENCOUNTER — Other Ambulatory Visit: Payer: Self-pay

## 2022-01-20 DIAGNOSIS — L309 Dermatitis, unspecified: Secondary | ICD-10-CM | POA: Diagnosis not present

## 2022-01-20 DIAGNOSIS — L308 Other specified dermatitis: Secondary | ICD-10-CM | POA: Diagnosis not present

## 2022-01-20 DIAGNOSIS — R21 Rash and other nonspecific skin eruption: Secondary | ICD-10-CM

## 2022-01-20 NOTE — Progress Notes (Signed)
° °  Follow-Up Visit   Subjective  Crystal Higgins is a 61 y.o. female who presents for the following: Rash.  Patient presents for rash of the face since October 2022. Areas burn at first, then turn itchy and stay in same place. She is using hydrocortisone 2.5% cream and ketoconazole 2% cream. Areas did clear when she was on Prednisone for a sickness, but came back a few days later. She has had similar problem on upper chest in the past, improved with TMC 0.1% Cream. She sees a rheumatologist for dry mouth and has had a positive ANA in the past. Not worse in sun that she has noticed.  The following portions of the chart were reviewed this encounter and updated as appropriate:       Review of Systems:  No other skin or systemic complaints except as noted in HPI or Assessment and Plan.  Objective  Well appearing patient in no apparent distress; mood and affect are within normal limits.  A focused examination was performed including face. Relevant physical exam findings are noted in the Assessment and Plan.  face Light pink slightly edematous small plaques with mild scale on the left cheek > right cheek         Assessment & Plan  Rash face  Chronic facial rash not responding to treatment, unclear etiology and uncertain prognosis.  Pt has h/o positive ANA. Skin biopsy today.  Continue ketoconazole 2% cream and HC 2.5% cream qd/bid to aas face until f/up.  May adjust treatment pending biopsy results  Skin / nail biopsy - face Type of biopsy: punch   Informed consent: discussed and consent obtained   Patient was prepped and draped in usual sterile fashion: Area prepped with alcohol. Anesthesia: the lesion was anesthetized in a standard fashion   Anesthetic:  1% lidocaine w/ epinephrine 1-100,000 buffered w/ 8.4% NaHCO3 Punch size:  3 mm Suture size:  5-0 Suture type: nylon   Suture removal (days):  7 Hemostasis achieved with: suture and pressure   Outcome: patient tolerated  procedure well   Post-procedure details: wound care instructions given   Post-procedure details comment:  Ointment and pressure dressing applied  Specimen 1 - Surgical pathology Differential Diagnosis: Atypical Seb Derm/Atopic Derm vs SCLE/Tumid LE vs GA Persistent small, itchy, pink scaly plaques with mild scale of the face. Pt has a positive ANA in the past Check Margins: No 4mm punch   Return in about 1 week (around 01/27/2022) for suture removal, f/u bx.  IJamesetta Orleans, CMA, am acting as scribe for Brendolyn Patty, MD .  Documentation: I have reviewed the above documentation for accuracy and completeness, and I agree with the above.  Brendolyn Patty MD

## 2022-01-20 NOTE — Patient Instructions (Signed)

## 2022-01-27 ENCOUNTER — Ambulatory Visit (INDEPENDENT_AMBULATORY_CARE_PROVIDER_SITE_OTHER): Payer: BC Managed Care – PPO | Admitting: Dermatology

## 2022-01-27 ENCOUNTER — Other Ambulatory Visit: Payer: Self-pay

## 2022-01-27 DIAGNOSIS — L308 Other specified dermatitis: Secondary | ICD-10-CM

## 2022-01-27 DIAGNOSIS — L309 Dermatitis, unspecified: Secondary | ICD-10-CM

## 2022-01-27 MED ORDER — PIMECROLIMUS 1 % EX CREA: TOPICAL_CREAM | CUTANEOUS | 2 refills | Status: AC

## 2022-01-27 NOTE — Progress Notes (Signed)
? ?  Follow-Up Visit ?  ?Subjective  ?Crystal Higgins is a 61 y.o. female who presents for the following: Follow-up. ? ?Patient here for 1 week follow-up punch biopsy of the face. Biopsy results show Spongiotic Dermatitis. She is using ketoconazole 2% cream and HC 2.5% Cream. Rash has improved some since last visit. Rash is worse during the winter.  ? ?The following portions of the chart were reviewed this encounter and updated as appropriate:  ?  ?  ? ?Review of Systems:  No other skin or systemic complaints except as noted in HPI or Assessment and Plan. ? ?Objective  ?Well appearing patient in no apparent distress; mood and affect are within normal limits. ? ?A focused examination was performed including face. Relevant physical exam findings are noted in the Assessment and Plan. ? ?L lower cheek, R cheek and temple ?Face clear today ? ? ? ?Assessment & Plan  ?Dermatitis ?L lower cheek, R cheek and temple ? 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 ?Wound cleansed, sutures removed, Vaseline and bandage applied. Discussed pathology results. ? ?Start Elidel Cream Apply to AA face BID prn flares dsp 30g 2Rf. ?Continue Ketoconazole 2% cream QD as preventative. Patient flares more during the winter months which is c/w seb derm.Marland Kitchen ?Continue HC 2.5% Cream qd/bid to more itchy areas prn.  ? ?Discussed VaniCream products. Info given.  ? ?Discussed Patch Testing if not improving with topicals. ? ?pimecrolimus (ELIDEL) 1 % cream - L lower cheek, R cheek and temple ?Apply to affected areas rash twice daily as needed for rash. ? ? ?Return if symptoms worsen or fail to improve. ? ?I, Jamesetta Orleans, CMA, am acting as scribe for Brendolyn Patty, MD . ? ?Documentation: I have reviewed the above documentation for accuracy and completeness, and I agree with the above. ? ?Brendolyn Patty MD  ? ?

## 2022-01-27 NOTE — Patient Instructions (Addendum)
Start Elidel (pimecolimus) ointment - apply twice daily to affected areas rash as needed. ?Continue Ketoconazole Cream - May be applied to the face daily.  ?Continue Hydrocortisone 2.5% cream - Apply once or twice a day to more itchy areas of the face.  ? ?If You Need Anything After Your Visit ? ?If you have any questions or concerns for your doctor, please call our main line at [phone] and press option 4 to reach your doctor's medical assistant. If no one answers, please leave a voicemail as directed and we will return your call as soon as possible. Messages left after 4 pm will be answered the following business day.  ? ?You may also send [place] a message via MyChart. We typically respond to MyChart messages within 1-2 business days. ? ?For prescription refills, please ask your pharmacy to contact our office. Our fax number is [phone]. ? ?If you have an urgent issue when the clinic is closed that cannot wait until the next business day, you can page your doctor at the number below.   ? ?Please note that while we do our best to be available for urgent issues outside of office hours, we are not available 24/7.  ? ?If you have an urgent issue and are unable to reach [place], you may choose to seek medical care at your doctor's office, retail clinic, urgent care center, or emergency room. ? ?If you have a medical emergency, please immediately call 911 or go to the emergency department. ? ?Pager Numbers ? ?- Dr. Nehemiah Massed: [phone] ? ?- Dr. Laurence Ferrari: [phone] ? ?- Dr. Nicole Kindred: [phone] ? ?In the event of inclement weather, please call our main line at [phone] for an update on the status of any delays or closures. ? ?Dermatology Medication Tips: ?Please keep the boxes that topical medications come in in order to help keep track of the instructions about where and how to use these. Pharmacies typically print the medication instructions only on the boxes and not directly on the medication tubes.  ? ?If your  medication is too expensive, please contact our office at [phone] option 4 or send [place] a message through [place].  ? ?We are unable to tell what your co-pay for medications will be in advance as this is different depending on your insurance coverage. However, we may be able to find a substitute medication at lower cost or fill out paperwork to get insurance to cover a needed medication.  ? ?If a prior authorization is required to get your medication covered by your insurance company, please allow [place] 1-2 business days to complete this process. ? ?Drug prices often vary depending on where the prescription is filled and some pharmacies may offer cheaper prices. ? ?The website www.goodrx.com contains coupons for medications through different pharmacies. The prices here do not account for what the cost may be with help from insurance (it may be cheaper with your insurance), but the website can give you the price if you did not use any insurance.  ?- You can print the associated coupon and take it with your prescription to the pharmacy.  ?- You may also stop by our office during regular business hours and pick up a GoodRx coupon card.  ?- If you need your prescription sent electronically to a different pharmacy, notify our office through [facility] or by phone at [phone] option 4. ? ? ? ? ?Si Usted Necesita Algo Despu?s de Su Visita ? ?Tambi?n puede enviarnos un mensaje a trav?s de MyChart.  Por lo general respondemos a los mensajes de MyChart en el transcurso de 1 a 2 d?as h?biles. ? ?Para renovar recetas, por favor pida a su farmacia que se ponga en contacto con nuestra oficina. Nuestro n?mero de fax es el [phone]. ? ?Si tiene un asunto urgente cuando la cl?nica est? cerrada y que no puede esperar hasta el siguiente d?a h?bil, puede llamar/localizar a su doctor(a) al n?mero que aparece a continuaci?n.  ? ?Por favor, tenga en cuenta que aunque hacemos todo lo posible para estar disponibles para  asuntos urgentes fuera del horario de oficina, no estamos disponibles las 24 horas del d?a, los 7 d?as de la semana.  ? ?Si tiene un problema urgente y no puede comunicarse con nosotros, puede optar por buscar atenci?n m?dica  en el consultorio de su doctor(a), en una cl?nica privada, en un centro de atenci?n urgente o en una sala de emergencias. ? ?Si tiene Engineer, maintenance (IT) m?dica, por favor llame inmediatamente al 911 o vaya a la sala de emergencias. ? ?N?meros de b?per ? ?- Dr. Nehemiah Massed: [phone] ? ?- Dra. Moye: [phone] ? ?- Dra. Nicole Kindred: [phone] ? ?En caso de inclemencias del tiempo, por favor llame a nuestra l?nea principal al [phone] para una actualizaci?n sobre el estado de cualquier retraso o cierre. ? ?Consejos para la medicaci?n en dermatolog?a: ?Por favor, guarde las cajas en las que vienen los medicamentos de uso t?pico para ayudarle a seguir las instrucciones sobre d?nde y c?mo usarlos. Las farmacias generalmente imprimen las instrucciones del medicamento s?lo en las cajas y no directamente en los tubos del [place].  ? ?Si su medicamento es muy caro, por favor, p?ngase en contacto con Zigmund Daniel llamando al [phone] y presione la opci?n 4 o env?enos un mensaje a trav?s de MyChart.  ? ?No podemos decirle cu?l ser? su copago por los medicamentos por adelantado ya que esto es diferente dependiendo de la cobertura de su seguro. Sin embargo, es posible que podamos encontrar un medicamento sustituto a Electrical engineer un formulario para que el seguro cubra el medicamento que se considera necesario.  ? ?Si se requiere Ardelia Mems autorizaci?n previa para que su compa??a de seguros [place] su medicamento, por favor perm?tanos de 1 a 2 d?as h?biles para completar este proceso. ? ?Los precios de los medicamentos var?an con frecuencia dependiendo del Environmental consultant de d?nde se surte la receta y alguna farmacias pueden ofrecer precios m?s baratos. ? ?El sitio web www.goodrx.com tiene cupones para  medicamentos de Airline pilot. Los precios aqu? no tienen en cuenta lo que podr?a costar con la ayuda del seguro (puede ser m?s barato con su seguro), pero el sitio web puede darle el precio si no utiliz? ning?n seguro.  ?- Puede imprimir el cup?n correspondiente y llevarlo con su receta a la farmacia.  ?- Tambi?n puede pasar por nuestra oficina durante el horario de atenci?n regular y recoger una tarjeta de cupones de GoodRx.  ?- Si necesita que su receta se env?e electr?nicamente a Chiropodist, informe a nuestra oficina a trav?s de MyChart de [place] o por tel?fono llamando al [phone] y presione la opci?n 4. ? ?

## 2022-01-28 ENCOUNTER — Encounter: Payer: Self-pay | Admitting: Family Medicine

## 2022-01-28 DIAGNOSIS — G43809 Other migraine, not intractable, without status migrainosus: Secondary | ICD-10-CM

## 2022-01-29 NOTE — Telephone Encounter (Addendum)
We have 1 box available.  Samples placed up front for pick up. I called and advised patient of this message.  ?

## 2022-01-29 NOTE — Telephone Encounter (Signed)
Can you see if we have any more samples of Nurtec. It will probably take a few weeks to get approval. She had a box or two of samples if we have them. Thanks. ?

## 2022-02-03 ENCOUNTER — Encounter: Payer: Self-pay | Admitting: Family Medicine

## 2022-02-03 DIAGNOSIS — J029 Acute pharyngitis, unspecified: Secondary | ICD-10-CM

## 2022-02-03 MED ORDER — CEPHALEXIN 500 MG PO CAPS: 500.0000 mg | ORAL_CAPSULE | Freq: Four times a day (QID) | ORAL | 0 refills | Status: AC

## 2022-02-05 ENCOUNTER — Ambulatory Visit: Payer: Self-pay | Admitting: *Deleted

## 2022-02-05 MED ORDER — PREDNISONE 10 MG PO TABS: ORAL_TABLET | ORAL | 0 refills | Status: AC

## 2022-02-05 NOTE — Addendum Note (Signed)
Addended by: Birdie Sons on: 02/05/2022 03:07 PM ? ? Modules accepted: Orders ? ?

## 2022-02-05 NOTE — Telephone Encounter (Signed)
? ? ?  Chief Complaint: Left tonsil swollen ?Symptoms: Left tonsil swollen, difficulty swallowing ?Frequency: Seen for tonsillitis, started antibiotic yesterday ?Pertinent Negatives: Patient denies SOB ?Disposition: '[]'$ ED /'[]'$ Urgent Care (no appt availability in office) / '[]'$ Appointment(In office/virtual)/ '[]'$  [facility]/ '[]'$ Home Care/ '[]'$ Refused Recommended Disposition /'[]'$ [facility]/ '[x]'$  Follow-up with PCP ?Additional Notes: Pt states Dr. Caryn Section called in antibiotics states "I forgot to mention I usually take prednisone along with ATBs." Requesting they be called in to [facility] pt NT would route to practice for PCPs review and final disposition. Advised ED for worsening symptoms, SOB. Pt verbalizes understanding.Please advise. ? ? Reason for Disposition ? [1] Swallowing difficulty AND [2] cause unknown (Exception: difficulty swallowing is a chronic symptom) ?   Pt seen for tonsillitis. ? ?Answer Assessment - Initial Assessment Questions ?1. SYMPTOM: "Are you having difficulty swallowing liquids, solids, or both?" ?    All ?2. ONSET: "When did the swallowing problems begin?"  ?    Dr. Caryn Section called in the antibiotics, started Wednesday ?3. CAUSE: "What do you think is causing the problem?"  ?    I Have tonsillitis. ?4. CHRONIC/RECURRENT: "Is this a new problem for you?"  If no, ask: "How long have you had this problem?" (e.g., days, weeks, months)  ?    no ?5. OTHER SYMPTOMS: "Do you have any other symptoms?" (e.g., difficulty breathing, sore throat, swollen tongue, chest pain) ?    Left side tonsil swollen ? ?Protocols used: Swallowing Difficulty-A-AH ? ?

## 2022-02-24 DIAGNOSIS — R070 Pain in throat: Secondary | ICD-10-CM | POA: Diagnosis not present

## 2022-02-24 DIAGNOSIS — J340 Abscess, furuncle and carbuncle of nose: Secondary | ICD-10-CM | POA: Diagnosis not present

## 2022-03-18 ENCOUNTER — Other Ambulatory Visit: Payer: Self-pay | Admitting: Family Medicine

## 2022-03-18 DIAGNOSIS — G43809 Other migraine, not intractable, without status migrainosus: Secondary | ICD-10-CM

## 2022-03-18 MED ORDER — NURTEC 75 MG PO TBDP: 75.0000 mg | ORAL_TABLET | ORAL | 3 refills | Status: DC

## 2022-03-19 ENCOUNTER — Other Ambulatory Visit: Payer: Self-pay | Admitting: Obstetrics and Gynecology

## 2022-03-19 DIAGNOSIS — N898 Other specified noninflammatory disorders of vagina: Secondary | ICD-10-CM | POA: Diagnosis not present

## 2022-03-19 DIAGNOSIS — Z1231 Encounter for screening mammogram for malignant neoplasm of breast: Secondary | ICD-10-CM

## 2022-03-19 DIAGNOSIS — Z1272 Encounter for screening for malignant neoplasm of vagina: Secondary | ICD-10-CM | POA: Diagnosis not present

## 2022-03-19 DIAGNOSIS — Z01419 Encounter for gynecological examination (general) (routine) without abnormal findings: Secondary | ICD-10-CM | POA: Diagnosis not present

## 2022-03-25 ENCOUNTER — Telehealth: Payer: Self-pay | Admitting: Family Medicine

## 2022-03-25 DIAGNOSIS — G43809 Other migraine, not intractable, without status migrainosus: Secondary | ICD-10-CM

## 2022-03-25 NOTE — Telephone Encounter (Signed)
Please check with patient regarding Nurtec prescription. Her insurance said it should be covered if prescribed QOD, so new prescription was sent last week... was she able to get this filled? ?

## 2022-03-25 NOTE — Telephone Encounter (Signed)
Pt informed and well check with the pharmacy.  Advised to let us know if any problems.  ?

## 2022-03-26 NOTE — Telephone Encounter (Signed)
Tried calling patient's insurance to ask about coverage on this medication. I was on hold for over 20 minutes with no answer. I left a message on the voice message system, which stated all calls would be returned in 3 days. Will try calling back on the next business day to follow up with insurance.  ? ?[place] Phone number: [phone] or [phone]  ? ?Member ID:  [id number] ?Group #: [id number] ?

## 2022-03-26 NOTE — Telephone Encounter (Signed)
Pt called back today / she called pharmacy and they advised her that the [place] was not approved and that they had faxed the office something from cover my meds / pt was not able to get the RX filled / please advise asap  ?

## 2022-03-27 ENCOUNTER — Other Ambulatory Visit: Payer: Self-pay | Admitting: Family Medicine

## 2022-03-27 DIAGNOSIS — G43809 Other migraine, not intractable, without status migrainosus: Secondary | ICD-10-CM

## 2022-03-27 NOTE — Telephone Encounter (Signed)
rx was sent to pharmacy on 03/18/22 #45/3 by provider. ?Requested Prescriptions  ?Pending Prescriptions Disp Refills  ?? NURTEC 75 MG TBDP [Pharmacy Med Name: NURTEC '75MG'$  ODT TABLETS] 45 tablet 3  ?  Sig: TAKE 1 TABLET BY MOUTH EVERY OTHER DAY AS NEEDED FOR MIGRAINE HEADACHE.  ?  ? Off-Protocol Failed - 03/27/2022  4:51 PM  ?  ?  Failed - Medication not assigned to a protocol, review manually.  ?  ?  Passed - Valid encounter within last 12 months  ?  Recent Outpatient Visits   ?      ? 2 months ago Primary hypertension  ? [facility] Birdie Sons, MD  ? 3 months ago Acute non-recurrent frontal sinusitis  ? [facility] Thedore Mins, Ria Comment, PA-C  ? 11 months ago Pharyngitis, unspecified etiology  ? [facility] Caryn Section, Kirstie Peri, MD  ? 1 year ago Primary hypertension  ? [facility] Caryn Section, Kirstie Peri, MD  ? 1 year ago Primary hypertension  ? [facility] Caryn Section, Kirstie Peri, MD  ?  ?  ? ?  ?  ?  ? ? ?

## 2022-03-28 ENCOUNTER — Encounter: Payer: Self-pay | Admitting: Family Medicine

## 2022-03-30 MED ORDER — NURTEC 75 MG PO TBDP: 75.0000 mg | ORAL_TABLET | ORAL | 3 refills | Status: DC

## 2022-03-30 NOTE — Telephone Encounter (Signed)
Patient advised.

## 2022-03-30 NOTE — Telephone Encounter (Signed)
Tried calling patient. No answer.  ? ?I called pharmacy to make sure prescription went through. Pharmacist was unable to check coverage on prescription that was sent on 03/18/2022. She states "the prescription from 03/18/2022 was corrupted in our system, so I cant pull it to see if its covered". Pharmacist is requesting that we resubmit the prescription for Nurtec. Prescription sent in. ?

## 2022-03-30 NOTE — Telephone Encounter (Signed)
Please advise patient we got a fax stating the Nurtec prescription was approved.  ?

## 2022-04-18 ENCOUNTER — Other Ambulatory Visit: Payer: Self-pay | Admitting: Family Medicine

## 2022-04-21 ENCOUNTER — Telehealth (INDEPENDENT_AMBULATORY_CARE_PROVIDER_SITE_OTHER): Payer: BC Managed Care – PPO | Admitting: Family Medicine

## 2022-04-21 ENCOUNTER — Encounter: Payer: Self-pay | Admitting: Family Medicine

## 2022-04-21 DIAGNOSIS — G43809 Other migraine, not intractable, without status migrainosus: Secondary | ICD-10-CM | POA: Diagnosis not present

## 2022-04-21 DIAGNOSIS — H9202 Otalgia, left ear: Secondary | ICD-10-CM

## 2022-04-21 MED ORDER — AZITHROMYCIN 250 MG PO TABS: ORAL_TABLET | ORAL | 0 refills | Status: AC

## 2022-04-21 NOTE — Progress Notes (Signed)
MyChart Video Visit    Virtual Visit via Video Note   This visit type was conducted due to national recommendations for restrictions regarding the COVID-19 Pandemic (e.g. social distancing) in an effort to limit this patient's exposure and mitigate transmission in our community. This patient is at least at moderate risk for complications without adequate follow up. This format is felt to be most appropriate for this patient at this time. Physical exam was limited by quality of the video and audio technology used for the visit.   Patient location: home Provider location: bfp  I discussed the limitations of evaluation and management by telemedicine and the availability of in person appointments. The patient expressed understanding and agreed to proceed.  Patient: Crystal Higgins   DOB: 02-22-1961   61 y.o. Female  MRN: [id number] Visit Date: 04/21/2022  Today's healthcare provider: Lelon Huh, MD    Subjective    Otalgia  Pertinent negatives include no abdominal pain or vomiting.   Patient has left ear pain. Starting hurting 4 days ago. No other URI. Patient states ciprodex ear drops are not working.   She also states that Nurtec was working well but upset stomach and has worse rebound on days she didn't take it.    Medications: Outpatient Medications Prior to Visit  Medication Sig   amLODipine (NORVASC) 2.5 MG tablet Take 1 tablet (2.5 mg total) by mouth daily.   Butalbital-APAP-Caffeine (FIORICET) 50-300-40 MG CAPS Take 1-2 tablets by mouth every 6 (six) hours as needed for headache   cholecalciferol (VITAMIN D) 1000 UNITS tablet Take by mouth.   CIPRODEX OTIC suspension 4 drops into left ear  twice daily   docusate sodium (COLACE) 100 MG capsule Take 100 mg by mouth daily.   hydrocortisone 2.5 % cream Apply topically 2 (two) times daily.   Multiple Vitamin (MULTIVITAMIN WITH MINERALS) TABS tablet Take 1 tablet by mouth daily.   omeprazole (PRILOSEC) 20 MG capsule TAKE 1  CAPSULE DAILY   pilocarpine (SALAGEN) 5 MG tablet Take 5 mg by mouth 3 (three) times daily.   pimecrolimus (ELIDEL) 1 % cream Apply to affected areas rash twice daily as needed for rash.   polyethylene glycol powder (GLYCOLAX/MIRALAX) 17 GM/SCOOP powder Take 1 Container by mouth once.   vitamin B-12 (CYANOCOBALAMIN) 500 MCG tablet Take 500 mcg by mouth every other day.   Rimegepant Sulfate (NURTEC) 75 MG TBDP Take 75 mg by mouth every other day. As needed for migraine headaches. (Patient not taking: Reported on 04/21/2022)   No facility-administered medications prior to visit.    Review of Systems  Constitutional:  Negative for appetite change, chills, fatigue and fever.  HENT:  Positive for ear pain.   Respiratory:  Negative for chest tightness and shortness of breath.   Cardiovascular:  Negative for chest pain and palpitations.  Gastrointestinal:  Negative for abdominal pain, nausea and vomiting.  Neurological:  Negative for dizziness and weakness.      Objective     97.6. BP 128/78.     Physical Exam   Awake, alert, oriented x 3. In no apparent distress    Assessment & Plan     1. Left ear pain - azithromycin (ZITHROMAX) 250 MG tablet; Take 2 tablets on day 1, then 1 tablet daily on days 2 through 5  Dispense: 6 tablet; Refill: 0   Continue Ciprodex ear drops.   2. Other migraine without status migrainosus, not intractable Off Nurtec due to upsetting stomach and what sounds  like rebound headaches on days she does not take it.       I discussed the assessment and treatment plan with the patient. The patient was provided an opportunity to ask questions and all were answered. The patient agreed with the plan and demonstrated an understanding of the instructions.   The patient was advised to call back or seek an in-person evaluation if the symptoms worsen or if the condition fails to improve as anticipated.  I provided 9 minutes of non-face-to-face time during this  encounter.  The entirety of the information documented in the History of Present Illness, Review of Systems and Physical Exam were personally obtained by me. Portions of this information were initially documented by the CMA and reviewed by me for thoroughness and accuracy.    Lelon Huh, MD [facility] [phone] (phone) [phone] (fax)  [place]

## 2022-05-01 ENCOUNTER — Ambulatory Visit
Admission: RE | Admit: 2022-05-01 | Discharge: 2022-05-01 | Disposition: A | Discharge status: Home / Self Care | Payer: BC Managed Care – PPO | Source: Ambulatory Visit | Attending: Obstetrics and Gynecology | Admitting: Obstetrics and Gynecology

## 2022-05-01 DIAGNOSIS — Z1231 Encounter for screening mammogram for malignant neoplasm of breast: Secondary | ICD-10-CM | POA: Diagnosis not present

## 2022-05-20 ENCOUNTER — Other Ambulatory Visit: Payer: Self-pay

## 2022-05-20 MED ORDER — HYDROCORTISONE 2.5 % EX CREA: TOPICAL_CREAM | Freq: Two times a day (BID) | CUTANEOUS | 2 refills | Status: DC | PRN

## 2022-05-20 NOTE — Telephone Encounter (Signed)
Pt called for a refill of Hydrocortisone cream she uses prn for dermatitis on her face, okay sent Hydrocortisone 2.5% cream 30 gm 2 RF to [facility] on [address]

## 2022-06-05 ENCOUNTER — Telehealth (INDEPENDENT_AMBULATORY_CARE_PROVIDER_SITE_OTHER): Payer: BC Managed Care – PPO | Admitting: Family Medicine

## 2022-06-05 DIAGNOSIS — H66005 Acute suppurative otitis media without spontaneous rupture of ear drum, recurrent, left ear: Secondary | ICD-10-CM

## 2022-06-05 DIAGNOSIS — J069 Acute upper respiratory infection, unspecified: Secondary | ICD-10-CM | POA: Diagnosis not present

## 2022-06-05 MED ORDER — CLARITHROMYCIN 500 MG PO TABS: 500.0000 mg | ORAL_TABLET | Freq: Two times a day (BID) | ORAL | 0 refills | Status: DC

## 2022-06-05 NOTE — Progress Notes (Signed)
MyChart Video Visit    Virtual Visit via Video Note   This visit type was conducted due to national recommendations for restrictions regarding the COVID-19 Pandemic (e.g. social distancing) in an effort to limit this patient's exposure and mitigate transmission in our community. This patient is at least at moderate risk for complications without adequate follow up. This format is felt to be most appropriate for this patient at this time. Physical exam was limited by quality of the video and audio technology used for the visit.   Patient location: home Provider location: bfp  I discussed the limitations of evaluation and management by telemedicine and the availability of in person appointments. The patient expressed understanding and agreed to proceed.  Patient: Crystal Higgins   DOB: 1961/08/04   61 y.o. Female  MRN: [id number] Visit Date: 06/05/2022  Today's healthcare provider: Lelon Huh, MD   Chief Complaint  Patient presents with   Sore Throat   Subjective    Sore Throat  This is a new problem. Episode onset: 1 week ago. The problem has been gradually worsening. Associated symptoms include coughing (dry), ear pain (left ear) and a hoarse voice. Pertinent negatives include no abdominal pain, shortness of breath or vomiting. She has tried nothing for the symptoms.  Started having sneezing this morning. Otherwise no runny or stuffy nose. No heartburn or other reflux symptoms.    Medications: Outpatient Medications Prior to Visit  Medication Sig   amLODipine (NORVASC) 2.5 MG tablet Take 1 tablet (2.5 mg total) by mouth daily.   Butalbital-APAP-Caffeine (FIORICET) 50-300-40 MG CAPS Take 1-2 tablets by mouth every 6 (six) hours as needed for headache   cholecalciferol (VITAMIN D) 1000 UNITS tablet Take by mouth.   CIPRODEX OTIC suspension 4 drops into left ear  twice daily   docusate sodium (COLACE) 100 MG capsule Take 100 mg by mouth daily.   hydrocortisone 2.5 % cream  Apply topically 2 (two) times daily.   hydrocortisone 2.5 % cream Apply topically 2 (two) times daily as needed (Rash).   Multiple Vitamin (MULTIVITAMIN WITH MINERALS) TABS tablet Take 1 tablet by mouth daily.   omeprazole (PRILOSEC) 20 MG capsule TAKE 1 CAPSULE DAILY   pilocarpine (SALAGEN) 5 MG tablet Take 5 mg by mouth 3 (three) times daily.   pimecrolimus (ELIDEL) 1 % cream Apply to affected areas rash twice daily as needed for rash.   polyethylene glycol powder (GLYCOLAX/MIRALAX) 17 GM/SCOOP powder Take 1 Container by mouth once.   vitamin B-12 (CYANOCOBALAMIN) 500 MCG tablet Take 500 mcg by mouth every other day.   No facility-administered medications prior to visit.    Review of Systems  Constitutional:  Negative for appetite change, chills, fatigue and fever.  HENT:  Positive for ear pain (left ear) and hoarse voice.   Respiratory:  Positive for cough (dry). Negative for chest tightness and shortness of breath.   Cardiovascular:  Negative for chest pain and palpitations.  Gastrointestinal:  Negative for abdominal pain, nausea and vomiting.  Neurological:  Negative for dizziness and weakness.       Objective      Physical Exam   Awake, alert, oriented x 3. In no apparent distress   Assessment & Plan     1. Recurrent acute suppurative otitis media without spontaneous rupture of left tympanic membrane Signs of early infection secondary to viral uri  2. Upper respiratory tract infection, unspecified type Likely viral, although persists now for about a week. Recommend she give it  2-3 more days, but if symptoms do not improve or worsen, especially if she has worsening ear pain, then she is start  clarithromycin (BIAXIN) 500 MG tablet; Take 1 tablet (500 mg total) by mouth 2 (two) times daily for 10 days.  Dispense: 20 tablet; Refill: 0     I discussed the assessment and treatment plan with the patient. The patient was provided an opportunity to ask questions and all were  answered. The patient agreed with the plan and demonstrated an understanding of the instructions.   The patient was advised to call back or seek an in-person evaluation if the symptoms worsen or if the condition fails to improve as anticipated.  I provided 13 minutes of non-face-to-face time during this encounter.  The entirety of the information documented in the History of Present Illness, Review of Systems and Physical Exam were personally obtained by me. Portions of this information were initially documented by the CMA and reviewed by me for thoroughness and accuracy.    Lelon Huh, MD [facility] [phone] (phone) [phone] (fax)  [place]

## 2022-06-07 ENCOUNTER — Encounter: Payer: Self-pay | Admitting: Family Medicine

## 2022-06-08 ENCOUNTER — Other Ambulatory Visit: Payer: Self-pay | Admitting: Physician Assistant

## 2022-06-08 DIAGNOSIS — H66005 Acute suppurative otitis media without spontaneous rupture of ear drum, recurrent, left ear: Secondary | ICD-10-CM

## 2022-06-08 MED ORDER — AZITHROMYCIN 250 MG PO TABS
ORAL_TABLET | ORAL | 0 refills | Status: DC
Start: 2022-06-08 — End: 2022-09-16

## 2022-06-29 ENCOUNTER — Ambulatory Visit: Payer: BC Managed Care – PPO | Admitting: Dermatology

## 2022-06-29 DIAGNOSIS — M67441 Ganglion, right hand: Secondary | ICD-10-CM

## 2022-06-29 DIAGNOSIS — L281 Prurigo nodularis: Secondary | ICD-10-CM | POA: Diagnosis not present

## 2022-06-29 DIAGNOSIS — B351 Tinea unguium: Secondary | ICD-10-CM | POA: Diagnosis not present

## 2022-06-29 DIAGNOSIS — M67449 Ganglion, unspecified hand: Secondary | ICD-10-CM

## 2022-06-29 MED ORDER — MUPIROCIN 2 % EX OINT: 1.0000 | TOPICAL_OINTMENT | Freq: Two times a day (BID) | CUTANEOUS | 0 refills | Status: DC

## 2022-06-29 MED ORDER — TAVABOROLE 5 % EX SOLN: CUTANEOUS | 5 refills | Status: DC

## 2022-06-29 NOTE — Patient Instructions (Signed)
Due to recent changes in healthcare laws, you may see results of your pathology and/or laboratory studies on MyChart before the doctors have had a chance to review them. We understand that in some cases there may be results that are confusing or concerning to you. Please understand that not all results are received at the same time and often the doctors may need to interpret multiple results in order to provide you with the best plan of care or course of treatment. Therefore, we ask that you please give us 2 business days to thoroughly review all your results before contacting the office for clarification. Should we see a critical lab result, you will be contacted sooner.   If You Need Anything After Your Visit  If you have any questions or concerns for your doctor, please call our main line at [phone] and press option 4 to reach your doctor's medical assistant. If no one answers, please leave a voicemail as directed and we will return your call as soon as possible. Messages left after 4 pm will be answered the following business day.   You may also send us a message via MyChart. We typically respond to MyChart messages within 1-2 business days.  For prescription refills, please ask your pharmacy to contact our office. Our fax number is [phone].  If you have an urgent issue when the clinic is closed that cannot wait until the next business day, you can page your doctor at the number below.    Please note that while we do our best to be available for urgent issues outside of office hours, we are not available 24/7.   If you have an urgent issue and are unable to reach us, you may choose to seek medical care at your doctor's office, retail clinic, urgent care center, or emergency room.  If you have a medical emergency, please immediately call 911 or go to the emergency department.  Pager Numbers  - Dr. Kowalski: [phone]  - Dr. Moye: [phone]  - Dr. Stewart:  [phone]  In the event of inclement weather, please call our main line at [phone] for an update on the status of any delays or closures.  Dermatology Medication Tips: Please keep the boxes that topical medications come in in order to help keep track of the instructions about where and how to use these. Pharmacies typically print the medication instructions only on the boxes and not directly on the medication tubes.   If your medication is too expensive, please contact our office at [phone] option 4 or send us a message through MyChart.   We are unable to tell what your co-pay for medications will be in advance as this is different depending on your insurance coverage. However, we may be able to find a substitute medication at lower cost or fill out paperwork to get insurance to cover a needed medication.   If a prior authorization is required to get your medication covered by your insurance company, please allow us 1-2 business days to complete this process.  Drug prices often vary depending on where the prescription is filled and some pharmacies may offer cheaper prices.  The website www.goodrx.com contains coupons for medications through different pharmacies. The prices here do not account for what the cost may be with help from insurance (it may be cheaper with your insurance), but the website can give you the price if you did not use any insurance.  - You can print the associated coupon and take it with   your prescription to the pharmacy.  - You may also stop by our office during regular business hours and pick up a GoodRx coupon card.  - If you need your prescription sent electronically to a different pharmacy, notify our office through [facility] MyChart or by phone at [phone] option 4.     Si Usted Necesita Algo Despus de Su Visita  Tambin puede enviarnos un mensaje a travs de MyChart. Por lo general respondemos a los mensajes de MyChart en el transcurso de 1 a 2  das hbiles.  Para renovar recetas, por favor pida a su farmacia que se ponga en contacto con nuestra oficina. Nuestro nmero de fax es el [phone].  Si tiene un asunto urgente cuando la clnica est cerrada y que no puede esperar hasta el siguiente da hbil, puede llamar/localizar a su doctor(a) al nmero que aparece a continuacin.   Por favor, tenga en cuenta que aunque hacemos todo lo posible para estar disponibles para asuntos urgentes fuera del horario de oficina, no estamos disponibles las 24 horas del da, los 7 das de la semana.   Si tiene un problema urgente y no puede comunicarse con nosotros, puede optar por buscar atencin mdica  en el consultorio de su doctor(a), en una clnica privada, en un centro de atencin urgente o en una sala de emergencias.  Si tiene una emergencia mdica, por favor llame inmediatamente al 911 o vaya a la sala de emergencias.  Nmeros de bper  - Dr. Kowalski: [phone]  - Dra. Moye: [phone]  - Dra. Stewart: [phone]  En caso de inclemencias del tiempo, por favor llame a nuestra lnea principal al [phone] para una actualizacin sobre el estado de cualquier retraso o cierre.  Consejos para la medicacin en dermatologa: Por favor, guarde las cajas en las que vienen los medicamentos de uso tpico para ayudarle a seguir las instrucciones sobre dnde y cmo usarlos. Las farmacias generalmente imprimen las instrucciones del medicamento slo en las cajas y no directamente en los tubos del medicamento.   Si su medicamento es muy caro, por favor, pngase en contacto con nuestra oficina llamando al [phone] y presione la opcin 4 o envenos un mensaje a travs de MyChart.   No podemos decirle cul ser su copago por los medicamentos por adelantado ya que esto es diferente dependiendo de la cobertura de su seguro. Sin embargo, es posible que podamos encontrar un medicamento sustituto a menor costo o llenar un formulario para que el  seguro cubra el medicamento que se considera necesario.   Si se requiere una autorizacin previa para que su compaa de seguros cubra su medicamento, por favor permtanos de 1 a 2 das hbiles para completar este proceso.  Los precios de los medicamentos varan con frecuencia dependiendo del lugar de dnde se surte la receta y alguna farmacias pueden ofrecer precios ms baratos.  El sitio web www.goodrx.com tiene cupones para medicamentos de diferentes farmacias. Los precios aqu no tienen en cuenta lo que podra costar con la ayuda del seguro (puede ser ms barato con su seguro), pero el sitio web puede darle el precio si no utiliz ningn seguro.  - Puede imprimir el cupn correspondiente y llevarlo con su receta a la farmacia.  - Tambin puede pasar por nuestra oficina durante el horario de atencin regular y recoger una tarjeta de cupones de GoodRx.  - Si necesita que su receta se enve electrnicamente a una farmacia diferente, informe a nuestra oficina a travs de MyChart de [facility]   o por telfono llamando al [phone] y presione la opcin 4.  

## 2022-06-29 NOTE — Progress Notes (Signed)
   Follow-Up Visit   Subjective  Crystal Higgins is a 61 y.o. female who presents for the following: Skin Problem (Patient here today for some white discoloration under right pointer fingernail 7/12, then swelled up around cuticle 7/21. Some soreness, no drainage. No new products.). Also has bump on back of neck she would like checked.   The following portions of the chart were reviewed this encounter and updated as appropriate:       Review of Systems:  No other skin or systemic complaints except as noted in HPI or Assessment and Plan.  Objective  Well appearing patient in no apparent distress; mood and affect are within normal limits.  A focused examination was performed including right hand and fingers, fingernails. Relevant physical exam findings are noted in the Assessment and Plan.  Right 2nd Finger Proximal Nail Fold Mild edema and erythema at right index proximal nail fold     Right 2nd Finger Nail Plate Mild distal onycholysis      left posterior neck Firm pink flesh scaly papule    Assessment & Plan  Digital mucous cyst Right 2nd Finger Proximal Nail Fold  With h/o recent inflammation/infection (paronychia), improving  Start mupirocin ointment to affected area twice daily.   Patient defers oral antibiotic since improving.  mupirocin ointment (BACTROBAN) 2 % - Right 2nd Finger Proximal Nail Fold Apply 1 Application topically 2 (two) times daily.  Tinea unguium Right 2nd Finger Nail Plate  Vrs nail dystrophy due to trauma (from digital mucous cyst?)  Start Kerydin to affected fingernail nightly. Observation    Tavaborole 5 % SOLN - Right 2nd Finger Nail Plate Apply to affected fingernail nightly.  Prurigo nodularis left posterior neck  Benign-appearing.  Observation.  Call clinic for new or changing lesions.   Start pimecrolimus twice daily to affected area. Patient has.   Sample of Delos Haring given to patient to use once daily to affected area.    Avoid picking/rubbing   Return if symptoms worsen or fail to improve.  Graciella Belton, RMA, am acting as scribe for Brendolyn Patty, MD .  Documentation: I have reviewed the above documentation for accuracy and completeness, and I agree with the above.  Brendolyn Patty MD

## 2022-07-16 ENCOUNTER — Other Ambulatory Visit: Payer: Self-pay

## 2022-07-16 ENCOUNTER — Encounter: Payer: Self-pay | Admitting: Dermatology

## 2022-07-16 MED ORDER — DOXYCYCLINE MONOHYDRATE 100 MG PO CAPS: 100.0000 mg | ORAL_CAPSULE | Freq: Two times a day (BID) | ORAL | 0 refills | Status: AC

## 2022-07-16 NOTE — Telephone Encounter (Signed)
Please offer patient an oral antibiotic. I would suggest doxycycline monohydrate 100 mg twice a day for 7 days (less likely to cause nausea than other doxycycline) as the best option if she is open to trying it. Alternatively, we could try Bactrim DS, a different antibiotic that is a bit more likely to cause an allergy. Please let me know if she prefers the Bactrim. Thank you!

## 2022-07-16 NOTE — Progress Notes (Signed)
Doxycycline sent in for pt/sh

## 2022-07-16 NOTE — Telephone Encounter (Signed)
Nevermind. Already sent in. Thank you Davy Pique!

## 2022-07-16 NOTE — Telephone Encounter (Signed)
Please send in doxycycline. Thank you!

## 2022-07-20 DIAGNOSIS — R768 Other specified abnormal immunological findings in serum: Secondary | ICD-10-CM | POA: Diagnosis not present

## 2022-07-20 DIAGNOSIS — R682 Dry mouth, unspecified: Secondary | ICD-10-CM | POA: Diagnosis not present

## 2022-08-04 DIAGNOSIS — H9319 Tinnitus, unspecified ear: Secondary | ICD-10-CM | POA: Diagnosis not present

## 2022-08-04 DIAGNOSIS — R490 Dysphonia: Secondary | ICD-10-CM | POA: Diagnosis not present

## 2022-08-04 DIAGNOSIS — R682 Dry mouth, unspecified: Secondary | ICD-10-CM | POA: Diagnosis not present

## 2022-08-04 DIAGNOSIS — H6980 Other specified disorders of Eustachian tube, unspecified ear: Secondary | ICD-10-CM | POA: Diagnosis not present

## 2022-08-10 DIAGNOSIS — H00024 Hordeolum internum left upper eyelid: Secondary | ICD-10-CM | POA: Diagnosis not present

## 2022-08-24 DIAGNOSIS — H0014 Chalazion left upper eyelid: Secondary | ICD-10-CM | POA: Diagnosis not present

## 2022-09-15 DIAGNOSIS — H0014 Chalazion left upper eyelid: Secondary | ICD-10-CM | POA: Diagnosis not present

## 2022-09-16 ENCOUNTER — Telehealth (INDEPENDENT_AMBULATORY_CARE_PROVIDER_SITE_OTHER): Payer: BC Managed Care – PPO | Admitting: Family Medicine

## 2022-09-16 ENCOUNTER — Encounter: Payer: Self-pay | Admitting: Family Medicine

## 2022-09-16 DIAGNOSIS — H66005 Acute suppurative otitis media without spontaneous rupture of ear drum, recurrent, left ear: Secondary | ICD-10-CM | POA: Diagnosis not present

## 2022-09-16 MED ORDER — AZITHROMYCIN 250 MG PO TABS: ORAL_TABLET | ORAL | 0 refills | Status: AC

## 2022-09-16 NOTE — Progress Notes (Signed)
MyChart Video Visit    Virtual Visit via Video Note   This format is felt to be most appropriate for this patient at this time. Physical exam was limited by quality of the video and audio technology used for the visit.   Patient location: home Provider location: bfp  I discussed the limitations of evaluation and management by telemedicine and the availability of in person appointments. The patient expressed understanding and agreed to proceed.  Patient: Crystal Higgins   DOB: January 16, 1961   61 y.o. Female  MRN: [id number] Visit Date: 09/16/2022  Today's healthcare provider: Lelon Huh, MD   Chief Complaint  Patient presents with   Ear Pain   Subjective    Otalgia  There is pain in both ears. This is a recurrent (worse in left ear) problem. Episode onset: 10 days ago. The problem has been gradually worsening. There has been no fever. Pertinent negatives include no abdominal pain, coughing, diarrhea, ear discharge, rhinorrhea, sore throat or vomiting. She has tried ear drops for the symptoms. The treatment provided mild relief.    Patient describes quality of pain as achiness and pressure.   She did have follow up with Dr. Pryor Ochoa in August and prescribed nasal  Medications: Outpatient Medications Prior to Visit  Medication Sig   amLODipine (NORVASC) 2.5 MG tablet Take 1 tablet (2.5 mg total) by mouth daily.   Butalbital-APAP-Caffeine (FIORICET) 50-300-40 MG CAPS Take 1-2 tablets by mouth every 6 (six) hours as needed for headache   cholecalciferol (VITAMIN D) 1000 UNITS tablet Take by mouth.   CIPRODEX OTIC suspension 4 drops into left ear  twice daily   docusate sodium (COLACE) 100 MG capsule Take 100 mg by mouth daily.   hydrocortisone 2.5 % cream Apply topically 2 (two) times daily.   hydrocortisone 2.5 % cream Apply topically 2 (two) times daily as needed (Rash).   Multiple Vitamin (MULTIVITAMIN WITH MINERALS) TABS tablet Take 1 tablet by mouth daily.   mupirocin  ointment (BACTROBAN) 2 % Apply 1 Application topically 2 (two) times daily.   omeprazole (PRILOSEC) 20 MG capsule TAKE 1 CAPSULE DAILY   pilocarpine (SALAGEN) 5 MG tablet Take 5 mg by mouth 3 (three) times daily.   pimecrolimus (ELIDEL) 1 % cream Apply to affected areas rash twice daily as needed for rash.   polyethylene glycol powder (GLYCOLAX/MIRALAX) 17 GM/SCOOP powder Take 1 Container by mouth once.   Tavaborole 5 % SOLN Apply to affected fingernail nightly.   vitamin B-12 (CYANOCOBALAMIN) 500 MCG tablet Take 500 mcg by mouth every other day.   No facility-administered medications prior to visit.    Review of Systems  Constitutional:  Negative for appetite change, chills, fatigue and fever.  HENT:  Positive for ear pain. Negative for ear discharge, rhinorrhea and sore throat.   Respiratory:  Negative for cough, chest tightness and shortness of breath.   Cardiovascular:  Negative for chest pain and palpitations.  Gastrointestinal:  Negative for abdominal pain, diarrhea, nausea and vomiting.  Neurological:  Negative for dizziness and weakness.       Objective    There were no vitals taken for this visit.     Physical Exam   Awake, alert, oriented x 3. In no apparent distress   Assessment & Plan     1. Recurrent acute suppurative otitis media without spontaneous rupture of left tympanic membrane  - azithromycin (ZITHROMAX) 250 MG tablet; Take 2 tablets on day 1, then 1 tablet daily on days 2  through 5  Dispense: 6 tablet; Refill: 0  Other orders - neomycin-polymyxin b-dexamethasone (MAXITROL) 3.5-10000-0.1 OINT; Place 1 Application into the left eye in the morning, at noon, and at bedtime.     I discussed the assessment and treatment plan with the patient. The patient was provided an opportunity to ask questions and all were answered. The patient agreed with the plan and demonstrated an understanding of the instructions.   The patient was advised to call back or seek an  in-person evaluation if the symptoms worsen or if the condition fails to improve as anticipated.  I provided 8 minutes of non-face-to-face time during this encounter.  The entirety of the information documented in the History of Present Illness, Review of Systems and Physical Exam were personally obtained by me. Portions of this information were initially documented by the CMA and reviewed by me for thoroughness and accuracy.    Lelon Huh, MD [facility] [phone] (phone) [phone] (fax)  [place]

## 2022-10-16 ENCOUNTER — Other Ambulatory Visit: Payer: Self-pay | Admitting: Family Medicine

## 2022-10-16 DIAGNOSIS — G43809 Other migraine, not intractable, without status migrainosus: Secondary | ICD-10-CM

## 2022-10-17 ENCOUNTER — Other Ambulatory Visit: Payer: Self-pay | Admitting: Family Medicine

## 2022-10-17 DIAGNOSIS — I10 Essential (primary) hypertension: Secondary | ICD-10-CM

## 2022-11-05 ENCOUNTER — Encounter: Payer: Self-pay | Admitting: Family Medicine

## 2022-11-06 ENCOUNTER — Telehealth: Payer: Self-pay | Admitting: *Deleted

## 2022-11-06 MED ORDER — CEPHALEXIN 500 MG PO CAPS: 500.0000 mg | ORAL_CAPSULE | Freq: Four times a day (QID) | ORAL | 0 refills | Status: DC

## 2022-11-06 NOTE — Telephone Encounter (Signed)
Pt sent MyCHart message yesterday regarding symptoms, has not heard back, expresses frustration. Seems reluctant to triage, states ongoing issue and all info is in message.After hours call. Advised [facility] and offered to secure for pt, states she has done these in past and will do so this evening. Assured pt NT would route to practice for PCPs review.

## 2023-01-19 ENCOUNTER — Encounter: Payer: Self-pay | Admitting: Family Medicine

## 2023-01-20 ENCOUNTER — Telehealth: Payer: Self-pay

## 2023-01-20 DIAGNOSIS — R682 Dry mouth, unspecified: Secondary | ICD-10-CM | POA: Diagnosis not present

## 2023-01-20 DIAGNOSIS — R768 Other specified abnormal immunological findings in serum: Secondary | ICD-10-CM | POA: Diagnosis not present

## 2023-01-20 DIAGNOSIS — M21611 Bunion of right foot: Secondary | ICD-10-CM | POA: Diagnosis not present

## 2023-01-20 NOTE — Telephone Encounter (Signed)
Copied from [place] [id number]. Topic: General - Other >> Jan 20, 2023 12:16 PM Dominique A wrote: Reason for CRM: Pt is calling to see if PCP seen her mychart message she sent yesterday 01/19/23 and to see if he will be able to call a z pack in for her. Please advise.

## 2023-01-21 ENCOUNTER — Ambulatory Visit: Payer: Self-pay | Admitting: *Deleted

## 2023-01-21 MED ORDER — AZITHROMYCIN 250 MG PO TABS: ORAL_TABLET | ORAL | 0 refills | Status: AC

## 2023-01-21 NOTE — Addendum Note (Signed)
Addended by: Birdie Sons on: 01/21/2023 08:39 AM   Modules accepted: Orders

## 2023-01-21 NOTE — Telephone Encounter (Signed)
  Chief Complaint: Left ear is swollen.   Chronic issue with this ear.    Dr. Caryn Section calls in an antibiotic for this.  Symptoms: Left ear is swollen, a lot of pressure. Frequency: For the past 2 days.   Started bothering her last week. Pertinent Negatives: Patient denies fever Disposition: []$ ED /[]$ Urgent Care (no appt availability in office) / []$ Appointment(In office/virtual)/ []$  [place] [facility]/ []$ Home Care/ []$ Refused Recommended Disposition /[]$  Mobile Bus/ [x]$  Follow-up with PCP Additional Notes: Pt. Requesting Zithromax be called in for her chronic ear issue.   Dr. Caryn Section usually does this for her.  \

## 2023-01-21 NOTE — Telephone Encounter (Signed)
Prescription has been sent to [facility]

## 2023-01-21 NOTE — Telephone Encounter (Signed)
Reason for Disposition  Earache  (Exceptions: brief ear pain of < 60 minutes duration, earache occurring during air travel    Chronic issue with left ear.   Needs antibiotic.   Dr. Caryn Section calls in Zithromax for me.  Answer Assessment - Initial Assessment Questions 1. LOCATION: "Which ear is involved?"     Left ear.    I have a chronic condition with it.    I can't get the drop in anymore.  When it gets that swollen I call in and Dr. Caryn Section calls in an antibiotic.   This ear thing started about 6 years ago.  The Zithromax works best for this.   I let pt. Know Dr. Caryn Section is not in today.     She has sent a MyChart message and he has not responded.    "He usually responds back and lets me know he has sent in an antibiotic". 2. ONSET: "When did the ear start hurting"      In the last day or so the swelling has gotten bad.   The ear started bothering me last week.   When I can't get the drops in anymore due to the swelling I call in because at this point I need the antibiotic. 3. SEVERITY: "How bad is the pain?"  (Scale 1-10; mild, moderate or severe)   - MILD (1-3): doesn't interfere with normal activities    - MODERATE (4-7): interferes with normal activities or awakens from sleep    - SEVERE (8-10): excruciating pain, unable to do any normal activities      I had some drainage last night.    Sometimes my eardrum will burst.    I have a lot of pressure.   I have hearing loss anymore.     I've seen an ENT 4. URI SYMPTOMS: "Do you have a runny nose or cough?"     No 5. FEVER: "Do you have a fever?" If Yes, ask: "What is your temperature, how was it measured, and when did it start?"     No 6. CAUSE: "Have you been swimming recently?", "How often do you use Q-TIPS?", "Have you had any recent air travel or scuba diving?"     Not asked 7. OTHER SYMPTOMS: "Do you have any other symptoms?" (e.g., headache, stiff neck, dizziness, vomiting, runny nose, decreased hearing)     Denies any other symptoms.   8. PREGNANCY: "Is there any chance you are pregnant?" "When was your last menstrual period?"     N/A due to age  Protocols used: Earache-A-AH

## 2023-02-01 ENCOUNTER — Ambulatory Visit (INDEPENDENT_AMBULATORY_CARE_PROVIDER_SITE_OTHER): Payer: BC Managed Care – PPO | Admitting: Family Medicine

## 2023-02-01 ENCOUNTER — Encounter: Payer: Self-pay | Admitting: Family Medicine

## 2023-02-01 VITALS — BP 132/87 | Temp 97.5°F | Ht 63.0 in | Wt 126.0 lb

## 2023-02-01 DIAGNOSIS — Z0000 Encounter for general adult medical examination without abnormal findings: Secondary | ICD-10-CM | POA: Diagnosis not present

## 2023-02-01 DIAGNOSIS — I10 Essential (primary) hypertension: Secondary | ICD-10-CM | POA: Insufficient documentation

## 2023-02-01 DIAGNOSIS — Z23 Encounter for immunization: Secondary | ICD-10-CM | POA: Diagnosis not present

## 2023-02-01 DIAGNOSIS — R42 Dizziness and giddiness: Secondary | ICD-10-CM

## 2023-02-01 DIAGNOSIS — E538 Deficiency of other specified B group vitamins: Secondary | ICD-10-CM | POA: Diagnosis not present

## 2023-02-01 DIAGNOSIS — G43809 Other migraine, not intractable, without status migrainosus: Secondary | ICD-10-CM | POA: Diagnosis not present

## 2023-02-01 NOTE — Progress Notes (Signed)
Established patient visit   Patient: Crystal Higgins   DOB: 14-May-1961   62 y.o. Female  MRN: [id number] Visit Date: 02/01/2023  Today's healthcare provider: Lelon Huh, MD   Chief Complaint  Patient presents with   Annual Exam   Subjective    HPI Presents today for annual CPE and follow up blood pressure, chronic dizziness, recurrent otitis, and chronic headache. She has headaches every day for many years, typically takes one Excedrin every morning and Fioricet a few days a week if Excedrin doesn't take care of headache. She does report she has been having more ringing in hears and dizziness lately, along with burning sensation in right ear. She has had extensive ENT and neuro workup over the years. She has was found to have mild mastoid effusion of MRI of brain done 02/2020, previously followed by Dr. Ayesha Mohair and Dr. Manuella Ghazi.   She is up to date on routine Gyn screening, seen at [facility] gyn.   Medications: Outpatient Medications Prior to Visit  Medication Sig   amLODipine (NORVASC) 2.5 MG tablet TAKE 1 TABLET DAILY   Butalbital-APAP-Caffeine 50-300-40 MG CAPS TAKE 1 TO 2 CAPSULES BY MOUTH EVERY 6 HOURS AS NEEDED FOR  HEADACHE   cholecalciferol (VITAMIN D) 1000 UNITS tablet Take by mouth.   CIPRODEX OTIC suspension 4 drops into left ear  twice daily   docusate sodium (COLACE) 100 MG capsule Take 100 mg by mouth daily.   hydrocortisone 2.5 % cream Apply topically 2 (two) times daily.   hydrocortisone 2.5 % cream Apply topically 2 (two) times daily as needed (Rash).   Multiple Vitamin (MULTIVITAMIN WITH MINERALS) TABS tablet Take 1 tablet by mouth daily.   mupirocin ointment (BACTROBAN) 2 % Apply 1 Application topically 2 (two) times daily.   neomycin-polymyxin b-dexamethasone (MAXITROL) 3.5-10000-0.1 OINT Place 1 Application into the left eye in the morning, at noon, and at bedtime.   omeprazole (PRILOSEC) 20 MG capsule TAKE 1 CAPSULE DAILY   pilocarpine (SALAGEN) 5 MG tablet Take 5  mg by mouth 3 (three) times daily.   pimecrolimus (ELIDEL) 1 % cream Apply to affected areas rash twice daily as needed for rash.   polyethylene glycol powder (GLYCOLAX/MIRALAX) 17 GM/SCOOP powder Take 1 Container by mouth once.   Tavaborole 5 % SOLN Apply to affected fingernail nightly.   vitamin B-12 (CYANOCOBALAMIN) 500 MCG tablet Take 500 mcg by mouth every other day.   No facility-administered medications prior to visit.    Review of Systems  Constitutional:  Negative for chills, diaphoresis and fever.  HENT:  Positive for ear pain, hearing loss and tinnitus. Negative for congestion, ear discharge, nosebleeds, postnasal drip, rhinorrhea, sinus pressure, sinus pain, sneezing and sore throat.   Eyes:  Negative for photophobia, pain, discharge and redness.  Respiratory:  Negative for cough, shortness of breath, wheezing and stridor.   Cardiovascular:  Negative for chest pain, palpitations and leg swelling.  Gastrointestinal:  Negative for abdominal pain, blood in stool, constipation, diarrhea, nausea and vomiting.  Endocrine: Negative for polydipsia.  Genitourinary:  Negative for dysuria, flank pain, frequency, hematuria and urgency.  Musculoskeletal:  Negative for back pain, myalgias and neck pain.  Skin:  Negative for rash.  Allergic/Immunologic: Negative for environmental allergies.  Neurological:  Positive for dizziness. Negative for tremors, seizures, weakness and headaches.  Hematological:  Does not bruise/bleed easily.  Psychiatric/Behavioral:  Negative for hallucinations and suicidal ideas. The patient is not nervous/anxious.        Objective  BP 132/87 (BP Location: Right Arm, Patient Position: Sitting, Cuff Size: Normal)   Temp (!) 97.5 F (36.4 C)   Ht '5\' 3"'$  (1.6 m)   Wt 126 lb (57.2 kg)   SpO2 98%   BMI 22.32 kg/m    Physical Exam    General Appearance:    Well developed, well nourished female. Alert, cooperative, in no acute distress, appears stated age    Head:    Normocephalic, without obvious abnormality, atraumatic  Eyes:    PERRL, conjunctiva/corneas clear, EOM's intact, fundi    benign, both eyes  Ears:    Normal TM's and external ear canals, both ears  Nose:   Nares normal, septum midline, mucosa normal, no drainage    or sinus tenderness  Throat:   Lips, mucosa, and tongue normal; teeth and gums normal  Neck:   Supple, symmetrical, trachea midline, no adenopathy;    thyroid:  no enlargement/tenderness/nodules; no carotid   bruit or JVD  Back:     Symmetric, no curvature, ROM normal, no CVA tenderness  Lungs:     Clear to auscultation bilaterally, respirations unlabored  Chest Wall:    No tenderness or deformity   Heart:    Normal heart rate. Normal rhythm. No murmurs, rubs, or gallops.   Breast Exam:    deferred  Abdomen:     Soft, non-tender, bowel sounds active all four quadrants,    no masses, no organomegaly  Pelvic:    deferred  Extremities:   All extremities are intact. No cyanosis or edema  Pulses:   2+ and symmetric all extremities  Skin:   Skin color, texture, turgor normal, no rashes or lesions  Lymph nodes:   Cervical, supraclavicular, and axillary nodes normal  Neurologic:   CNII-XII intact, normal strength, sensation and reflexes    throughout     Assessment & Plan     1. Annual physical exam Gyn exam deferred to [facility] gyn  2. Primary hypertension Fairly well controlled. Continue current medications.   - Comprehensive metabolic panel - Magnesium - Lipid panel - CBC with Differential/Platelet  3. Other migraine without status migrainosus, not intractable Currently controlled with once daily Excedrin and occasional Fioricet.   4. B12 deficiency On QOD OTC B23 supplements.  - Vitamin B12 - Methylmalonic Acid  5. Need for diphtheria-tetanus-pertussis (Tdap) vaccine  - Tdap vaccine greater than or equal to 7yo IM  6. Dizziness Chronic, but progressive and changing in nature. Was noted to have possible  mastoid effusion on MRI in 2021. Consider follow up MRI if labs are normal.          Lelon Huh, MD  [place] [phone] (phone) [phone] (fax)  [place]

## 2023-02-06 LAB — COMPREHENSIVE METABOLIC PANEL
ALT: 25 IU/L (ref 0–32)
AST: 25 IU/L (ref 0–40)
Albumin/Globulin Ratio: 2.1 (ref 1.2–2.2)
Albumin: 4.6 g/dL (ref 3.9–4.9)
Alkaline Phosphatase: 80 IU/L (ref 44–121)
BUN/Creatinine Ratio: 20 (ref 12–28)
BUN: 13 mg/dL (ref 8–27)
Bilirubin Total: 0.3 mg/dL (ref 0.0–1.2)
CO2: 23 mmol/L (ref 20–29)
Calcium: 9.7 mg/dL (ref 8.7–10.3)
Chloride: 105 mmol/L (ref 96–106)
Creatinine, Ser: 0.66 mg/dL (ref 0.57–1.00)
Globulin, Total: 2.2 g/dL (ref 1.5–4.5)
Glucose: 86 mg/dL (ref 70–99)
Potassium: 4.1 mmol/L (ref 3.5–5.2)
Sodium: 142 mmol/L (ref 134–144)
Total Protein: 6.8 g/dL (ref 6.0–8.5)
eGFR: 100 mL/min/{1.73_m2} (ref >59)

## 2023-02-06 LAB — CBC WITH DIFFERENTIAL/PLATELET
Basophils Absolute: 0.1 10*3/uL (ref 0.0–0.2)
Basos: 1 %
EOS (ABSOLUTE): 0.3 10*3/uL (ref 0.0–0.4)
Eos: 3 %
Hematocrit: 43.2 % (ref 34.0–46.6)
Hemoglobin: 13.9 g/dL (ref 11.1–15.9)
Immature Grans (Abs): 0 10*3/uL (ref 0.0–0.1)
Immature Granulocytes: 0 %
Lymphocytes Absolute: 3 10*3/uL (ref 0.7–3.1)
Lymphs: 33 %
MCH: 28.5 pg (ref 26.6–33.0)
MCHC: 32.2 g/dL (ref 31.5–35.7)
MCV: 89 fL (ref 79–97)
Monocytes Absolute: 0.8 10*3/uL (ref 0.1–0.9)
Monocytes: 9 %
Neutrophils Absolute: 5 10*3/uL (ref 1.4–7.0)
Neutrophils: 54 %
Platelets: 385 10*3/uL (ref 150–450)
RBC: 4.88 x10E6/uL (ref 3.77–5.28)
RDW: 12.7 % (ref 11.7–15.4)
WBC: 9.1 10*3/uL (ref 3.4–10.8)

## 2023-02-06 LAB — LIPID PANEL
Chol/HDL Ratio: 4 ratio (ref 0.0–4.4)
Cholesterol, Total: 218 mg/dL — ABNORMAL HIGH (ref 100–199)
HDL: 54 mg/dL (ref >39)
LDL Chol Calc (NIH): 133 mg/dL — ABNORMAL HIGH (ref 0–99)
Triglycerides: 173 mg/dL — ABNORMAL HIGH (ref 0–149)
VLDL Cholesterol Cal: 31 mg/dL (ref 5–40)

## 2023-02-06 LAB — VITAMIN B12: Vitamin B-12: 757 pg/mL (ref 232–1245)

## 2023-02-06 LAB — METHYLMALONIC ACID, SERUM: Methylmalonic Acid: 150 nmol/L (ref 0–378)

## 2023-02-06 LAB — MAGNESIUM: Magnesium: 2.1 mg/dL (ref 1.6–2.3)

## 2023-02-08 ENCOUNTER — Encounter: Payer: Self-pay | Admitting: Family Medicine

## 2023-02-08 ENCOUNTER — Other Ambulatory Visit: Payer: Self-pay | Admitting: Family Medicine

## 2023-02-08 DIAGNOSIS — H838X3 Other specified diseases of inner ear, bilateral: Secondary | ICD-10-CM | POA: Insufficient documentation

## 2023-02-09 ENCOUNTER — Encounter: Payer: Self-pay | Admitting: Family Medicine

## 2023-02-15 ENCOUNTER — Telehealth: Payer: Self-pay

## 2023-02-15 NOTE — Telephone Encounter (Signed)
Copied from [place] [id number]. Topic: General - Inquiry >> Feb 15, 2023  9:03 AM Marcellus Scott wrote: Reason for CRM: Pt is calling back to follow up on MyChart messages sent on 03/18 to Dr.Fisher. Pt is asking if she should get another CT scan.  She mentioned this was discussed at her last visit.  Please advise.

## 2023-02-17 ENCOUNTER — Ambulatory Visit: Payer: Self-pay | Admitting: *Deleted

## 2023-02-17 NOTE — Telephone Encounter (Signed)
  Chief Complaint: sore throat Symptoms: sore throat, white spots on tonsils and swollen, runny nose, dizziness Frequency: ongoing since Sunday, runny nose started today Pertinent Negatives: pt denies fever Disposition: [] ED /[] Urgent Care (no appt availability in office) / [] Appointment(In office/virtual)/ []  [place] [facility]/ [] Home Care/ [x] Refused Recommended Disposition /[] [place] Mobile Bus/ [x]  Follow-up with PCP Additional Notes: pt states she has had strep throat years ago but has issues with her ears and gets dizziness. Pt unable to come to office today d/t dizziness. Recommended pt get tested for possible strep throat, no OV or VV appts available today. Pt is asking if provider can send in something for her for the sore throat. Advised I would send message back since no appts available and pt unable to drive to UC today.   Reason for Disposition  [1] Sore throat with cough/cold symptoms AND [2] present > 5 days  Answer Assessment - Initial Assessment Questions 1. ONSET: "When did the throat start hurting?" (Hours or days ago)      Sunday  2. SEVERITY: "How bad is the sore throat?" (Scale 1-10; mild, moderate or severe)   - MILD (1-3):  Doesn't interfere with eating or normal activities.   - MODERATE (4-7): Interferes with eating some solids and normal activities.   - SEVERE (8-10):  Excruciating pain, interferes with most normal activities.   - SEVERE WITH DYSPHAGIA (10): Can't swallow liquids, drooling.     Moderate  4.  VIRAL SYMPTOMS: "Are there any symptoms of a cold, such as a runny nose, cough, hoarse voice or red eyes?"      Runny nose  5. FEVER: "Do you have a fever?" If Yes, ask: "What is your temperature, how was it measured, and when did it start?"     no 6. PUS ON THE TONSILS: "Is there pus on the tonsils in the back of your throat?"     yes 7. OTHER SYMPTOMS: "Do you have any other symptoms?" (e.g., difficulty breathing, headache, rash)      no  Protocols used: Sore Throat-A-AH

## 2023-02-17 NOTE — Telephone Encounter (Signed)
Summary: Pt requests Rx for possible tonsillitis   Pt stated that she possibly has tonsillitis and requests that a Rx be sent in to [place], [place]. Pt does not want to come in and stated she does not think that Dr. Caryn Section wants her to come in. Cb# [phone]          Called patient to review sx of "tonsillitis" . No answer, LVMTCB [phone].

## 2023-02-18 MED ORDER — AZITHROMYCIN 250 MG PO TABS: ORAL_TABLET | ORAL | 0 refills | Status: AC

## 2023-02-18 NOTE — Addendum Note (Signed)
Addended by: Birdie Sons on: 02/18/2023 09:13 AM   Modules accepted: Orders

## 2023-02-19 ENCOUNTER — Telehealth: Payer: BC Managed Care – PPO | Admitting: Family Medicine

## 2023-02-23 ENCOUNTER — Ambulatory Visit: Payer: BC Managed Care – PPO | Admitting: Dermatology

## 2023-03-06 ENCOUNTER — Encounter: Payer: Self-pay | Admitting: Family Medicine

## 2023-03-06 DIAGNOSIS — H7012 Chronic mastoiditis, left ear: Secondary | ICD-10-CM

## 2023-03-06 DIAGNOSIS — H838X3 Other specified diseases of inner ear, bilateral: Secondary | ICD-10-CM

## 2023-03-10 DIAGNOSIS — H7012 Chronic mastoiditis, left ear: Secondary | ICD-10-CM | POA: Insufficient documentation

## 2023-03-19 ENCOUNTER — Telehealth: Payer: Self-pay | Admitting: Family Medicine

## 2023-03-19 NOTE — Telephone Encounter (Signed)
Melissa with [facility] is calling because pt is scheduled for a CT scan on 03/24/23 and per [employer] is requiring a PA before the scan can be performed.

## 2023-03-23 ENCOUNTER — Other Ambulatory Visit: Payer: Self-pay | Admitting: Family Medicine

## 2023-03-23 DIAGNOSIS — Z1231 Encounter for screening mammogram for malignant neoplasm of breast: Secondary | ICD-10-CM

## 2023-03-24 ENCOUNTER — Ambulatory Visit
Admission: RE | Admit: 2023-03-24 | Discharge: 2023-03-24 | Disposition: A | Discharge status: Home / Self Care | Payer: BC Managed Care – PPO | Source: Ambulatory Visit | Attending: Family Medicine | Admitting: Family Medicine

## 2023-03-24 DIAGNOSIS — H838X3 Other specified diseases of inner ear, bilateral: Secondary | ICD-10-CM | POA: Diagnosis not present

## 2023-03-24 DIAGNOSIS — J3489 Other specified disorders of nose and nasal sinuses: Secondary | ICD-10-CM | POA: Diagnosis not present

## 2023-03-25 ENCOUNTER — Encounter: Payer: Self-pay | Admitting: Family Medicine

## 2023-03-25 ENCOUNTER — Ambulatory Visit: Payer: Self-pay

## 2023-03-25 ENCOUNTER — Ambulatory Visit: Payer: BC Managed Care – PPO | Admitting: Family Medicine

## 2023-03-25 ENCOUNTER — Ambulatory Visit
Admission: RE | Admit: 2023-03-25 | Discharge: 2023-03-25 | Disposition: A | Discharge status: Home / Self Care | Payer: BC Managed Care – PPO | Source: Ambulatory Visit | Attending: Family Medicine | Admitting: Family Medicine

## 2023-03-25 VITALS — BP 131/77 | HR 79 | Temp 98.0°F | Ht 63.0 in | Wt 126.0 lb

## 2023-03-25 DIAGNOSIS — R109 Unspecified abdominal pain: Secondary | ICD-10-CM

## 2023-03-25 DIAGNOSIS — K824 Cholesterolosis of gallbladder: Secondary | ICD-10-CM | POA: Diagnosis not present

## 2023-03-25 DIAGNOSIS — Z1159 Encounter for screening for other viral diseases: Secondary | ICD-10-CM | POA: Diagnosis not present

## 2023-03-25 DIAGNOSIS — Z114 Encounter for screening for human immunodeficiency virus [HIV]: Secondary | ICD-10-CM | POA: Insufficient documentation

## 2023-03-25 LAB — POCT URINALYSIS DIPSTICK
Bilirubin, UA: NEGATIVE
Blood, UA: NEGATIVE
Glucose, UA: NEGATIVE
Ketones, UA: NEGATIVE
Leukocytes, UA: NEGATIVE
Nitrite, UA: NEGATIVE
Protein, UA: NEGATIVE
Spec Grav, UA: >=1.03 — AB (ref 1.010–1.025)
Urobilinogen, UA: NEGATIVE E.U./dL — AB
pH, UA: 7 (ref 5.0–8.0)

## 2023-03-25 NOTE — Progress Notes (Signed)
Established patient visit  Patient: Crystal Higgins   DOB: 1961-02-10   62 y.o. Female  MRN: [id number] Visit Date: 03/25/2023  Today's healthcare provider: Jacky Kindle, FNP  Introduced to nurse practitioner role and practice setting.  All questions answered.  Discussed provider/patient relationship and expectations.   Chief Complaint  Patient presents with   Abdominal Pain    Pt stated --right side abdominal pain radiated to the back, nausea, bloated --worse when taking deep breath--4 days.   Subjective    Abdominal Pain   HPI     Abdominal Pain    Additional comments: Pt stated --right side abdominal pain radiated to the back, nausea, bloated --worse when taking deep breath--4 days.      Last edited by Shelly Bombard, CMA on 03/25/2023  9:46 AM.       Medications: Outpatient Medications Prior to Visit  Medication Sig   amLODipine (NORVASC) 2.5 MG tablet TAKE 1 TABLET DAILY   Butalbital-APAP-Caffeine 50-300-40 MG CAPS TAKE 1 TO 2 CAPSULES BY MOUTH EVERY 6 HOURS AS NEEDED FOR  HEADACHE   cholecalciferol (VITAMIN D) 1000 UNITS tablet Take by mouth.   CIPRODEX OTIC suspension 4 drops into left ear  twice daily   docusate sodium (COLACE) 100 MG capsule Take 100 mg by mouth daily.   hydrocortisone 2.5 % cream Apply topically 2 (two) times daily.   hydrocortisone 2.5 % cream Apply topically 2 (two) times daily as needed (Rash).   Multiple Vitamin (MULTIVITAMIN WITH MINERALS) TABS tablet Take 1 tablet by mouth daily.   mupirocin ointment (BACTROBAN) 2 % Apply 1 Application topically 2 (two) times daily.   neomycin-polymyxin b-dexamethasone (MAXITROL) 3.5-10000-0.1 OINT Place 1 Application into the left eye in the morning, at noon, and at bedtime.   omeprazole (PRILOSEC) 20 MG capsule TAKE 1 CAPSULE DAILY   pilocarpine (SALAGEN) 5 MG tablet Take 5 mg by mouth 3 (three) times daily.   pimecrolimus (ELIDEL) 1 % cream Apply to affected areas rash twice daily as needed for rash.    polyethylene glycol powder (GLYCOLAX/MIRALAX) 17 GM/SCOOP powder Take 1 Container by mouth once.   Tavaborole 5 % SOLN Apply to affected fingernail nightly.   vitamin B-12 (CYANOCOBALAMIN) 500 MCG tablet Take 500 mcg by mouth every other day.   No facility-administered medications prior to visit.    Review of Systems  Gastrointestinal:  Positive for abdominal pain.      Objective    BP 131/77 (BP Location: Right Arm, Patient Position: Sitting, Cuff Size: Normal)   Pulse 79   Temp 98 F (36.7 C)   Ht 5\' 3"  (1.6 m)   Wt 126 lb (57.2 kg)   BMI 22.32 kg/m   Physical Exam Vitals and nursing note reviewed.  Constitutional:      General: She is not in acute distress.    Appearance: Normal appearance. She is well-developed and normal weight. She is ill-appearing. She is not toxic-appearing or diaphoretic.  HENT:     Head: Normocephalic and atraumatic.  Cardiovascular:     Rate and Rhythm: Normal rate and regular rhythm.     Pulses: Normal pulses.     Heart sounds: Normal heart sounds. No murmur heard.    No friction rub. No gallop.  Pulmonary:     Effort: Pulmonary effort is normal. No respiratory distress.     Breath sounds: Normal breath sounds. No stridor. No wheezing, rhonchi or rales.     Comments: R abdominal pain with deep  breath Chest:     Chest wall: No tenderness.  Abdominal:     General: Abdomen is flat. Bowel sounds are normal. There is no distension or abdominal bruit. There are no signs of injury.     Palpations: Abdomen is soft.     Tenderness: There is abdominal tenderness in the right upper quadrant and epigastric area. There is guarding and rebound. There is no right CVA tenderness or left CVA tenderness.     Hernia: No hernia is present.     Comments: BM 5/1  Genitourinary:    Comments: - POC UA Musculoskeletal:        General: No swelling, tenderness, deformity or signs of injury. Normal range of motion.     Right lower leg: No edema.     Left lower leg:  No edema.  Skin:    General: Skin is warm and dry.     Capillary Refill: Capillary refill takes less than 2 seconds.     Coloration: Skin is not jaundiced or pale.     Findings: No bruising, erythema, lesion or rash.  Neurological:     General: No focal deficit present.     Mental Status: She is alert and oriented to person, place, and time. Mental status is at baseline.     Cranial Nerves: No cranial nerve deficit.     Sensory: No sensory deficit.     Motor: No weakness.     Coordination: Coordination normal.  Psychiatric:        Mood and Affect: Mood normal.        Behavior: Behavior normal.        Thought Content: Thought content normal.        Judgment: Judgment normal.      No results found for any visits on 03/25/23.  Assessment & Plan     Problem List Items Addressed This Visit       Other   Encounter for hepatitis C screening test for low risk patient    Low risk screen Treatable, and curable. If left untreated Hep C can lead to cirrhosis and liver failure. Encourage routine testing; recommend repeat testing if risk factors change.       Relevant Orders   Hepatitis C Antibody   Encounter for screening for HIV    Low risk screen Consented; encouraged to "know your status" Recommend repeat screen if risk factors change       Relevant Orders   HIV antibody (with reflex)   Radiating abdominal pain - Primary    Symptoms since Friday; impairing sleep and activities Denies sick contacts Denies travel UA negative No s/s jaundice Labs completed No family hx of gallbladder Will refer for [place]      Relevant Orders   POCT urinalysis dipstick   Amylase   Comprehensive Metabolic Panel (CMET)   AST   ALT   CBC with Differential/Platelet   Gamma GT   US Abdomen Limited RUQ (LIVER/GB)   Seek emergent care if s/s worse prior to scheduling [place].   Leilani Merl, FNP, have reviewed all documentation for this visit. The documentation on 03/25/23 for the exam,  diagnosis, procedures, and orders are all accurate and complete.  Jacky Kindle, FNP  [facility] [phone] (phone) [phone] (fax)  [facility]

## 2023-03-25 NOTE — Progress Notes (Signed)
Please call and tell patient and let her know she has a gallbladder polyp; continue dietary rest as recommended this am [no heavy meals, fats, high fiber, high spice content] and we will follow up on labs and repeat US in 1 year.

## 2023-03-25 NOTE — Assessment & Plan Note (Signed)
Low risk screen ?Consented; encouraged to "know your status" ?Recommend repeat screen if risk factors change ? ?

## 2023-03-25 NOTE — Patient Instructions (Signed)
The CDC recommends two doses of Shingrix (the new shingles vaccine) separated by 2 to 6 months for adults age 62 years and older. I recommend checking with your insurance plan regarding coverage for this vaccine.    

## 2023-03-25 NOTE — Telephone Encounter (Signed)
    Chief Complaint: Stabbing pain right upper quadrant Symptoms: Above Frequency: Monday Pertinent Negatives: Patient denies diarrhea, constipation Disposition: [] ED /[] Urgent Care (no appt availability in office) / [x] Appointment(In office/virtual)/ []  [place] [facility]/ [] Home Care/ [] Refused Recommended Disposition /[] [place] Mobile Bus/ []  Follow-up with PCP Additional Notes:    Reason for Disposition  [1] MILD-MODERATE pain AND [2] constant AND [3] present > 2 hours  Answer Assessment - Initial Assessment Questions 1. LOCATION: "Where does it hurt?"      Right upper 2. RADIATION: "Does the pain shoot anywhere else?" (e.g., chest, back)     No 3. ONSET: "When did the pain begin?" (e.g., minutes, hours or days ago)      Monday 4. SUDDEN: "Gradual or sudden onset?"     Gradual 5. PATTERN "Does the pain come and go, or is it constant?"    - If it comes and goes: "How long does it last?" "Do you have pain now?"     (Note: Comes and goes means the pain is intermittent. It goes away completely between bouts.)    - If constant: "Is it getting better, staying the same, or getting worse?"      (Note: Constant means the pain never goes away completely; most serious pain is constant and gets worse.)      Constant 6. SEVERITY: "How bad is the pain?"  (e.g., Scale 1-10; mild, moderate, or severe)    - MILD (1-3): Doesn't interfere with normal activities, abdomen soft and not tender to touch.     - MODERATE (4-7): Interferes with normal activities or awakens from sleep, abdomen tender to touch.     - SEVERE (8-10): Excruciating pain, doubled over, unable to do any normal activities.       6-10 7. RECURRENT SYMPTOM: "Have you ever had this type of stomach pain before?" If Yes, ask: "When was the last time?" and "What happened that time?"      No 8. CAUSE: "What do you think is causing the stomach pain?"     Unsure 9. RELIEVING/AGGRAVATING FACTORS: "What makes it better or worse?"  (e.g., antacids, bending or twisting motion, bowel movement)     No 10. OTHER SYMPTOMS: "Do you have any other symptoms?" (e.g., back pain, diarrhea, fever, urination pain, vomiting)       No 11. PREGNANCY: "Is there any chance you are pregnant?" "When was your last menstrual period?"       No  Protocols used: Abdominal Pain - [facility]

## 2023-03-25 NOTE — Assessment & Plan Note (Signed)
Symptoms since Friday; impairing sleep and activities Denies sick contacts Denies travel UA negative No s/s jaundice Labs completed No family hx of gallbladder Will refer for [place]

## 2023-03-25 NOTE — Assessment & Plan Note (Signed)
Low risk screen Treatable, and curable. If left untreated Hep C can lead to cirrhosis and liver failure. Encourage routine testing; recommend repeat testing if risk factors change.  

## 2023-03-26 ENCOUNTER — Encounter: Payer: Self-pay | Admitting: Family Medicine

## 2023-03-26 ENCOUNTER — Telehealth: Payer: BC Managed Care – PPO | Admitting: Family Medicine

## 2023-03-26 LAB — COMPREHENSIVE METABOLIC PANEL
ALT: 18 IU/L (ref 0–32)
AST: 23 IU/L (ref 0–40)
Albumin/Globulin Ratio: 1.9 (ref 1.2–2.2)
Albumin: 4.6 g/dL (ref 3.9–4.9)
Alkaline Phosphatase: 82 IU/L (ref 44–121)
BUN/Creatinine Ratio: 17 (ref 12–28)
BUN: 12 mg/dL (ref 8–27)
Bilirubin Total: 0.4 mg/dL (ref 0.0–1.2)
CO2: 22 mmol/L (ref 20–29)
Calcium: 9.9 mg/dL (ref 8.7–10.3)
Chloride: 102 mmol/L (ref 96–106)
Creatinine, Ser: 0.72 mg/dL (ref 0.57–1.00)
Globulin, Total: 2.4 g/dL (ref 1.5–4.5)
Glucose: 81 mg/dL (ref 70–99)
Potassium: 4.3 mmol/L (ref 3.5–5.2)
Sodium: 141 mmol/L (ref 134–144)
Total Protein: 7 g/dL (ref 6.0–8.5)
eGFR: 95 mL/min/{1.73_m2} (ref >59)

## 2023-03-26 LAB — CBC WITH DIFFERENTIAL/PLATELET
Basophils Absolute: 0.1 10*3/uL (ref 0.0–0.2)
Basos: 1 %
EOS (ABSOLUTE): 0.2 10*3/uL (ref 0.0–0.4)
Eos: 3 %
Hematocrit: 42.6 % (ref 34.0–46.6)
Hemoglobin: 14.5 g/dL (ref 11.1–15.9)
Immature Grans (Abs): 0 10*3/uL (ref 0.0–0.1)
Immature Granulocytes: 0 %
Lymphocytes Absolute: 2.6 10*3/uL (ref 0.7–3.1)
Lymphs: 32 %
MCH: 28.6 pg (ref 26.6–33.0)
MCHC: 34 g/dL (ref 31.5–35.7)
MCV: 84 fL (ref 79–97)
Monocytes Absolute: 0.6 10*3/uL (ref 0.1–0.9)
Monocytes: 7 %
Neutrophils Absolute: 4.6 10*3/uL (ref 1.4–7.0)
Neutrophils: 57 %
Platelets: 379 10*3/uL (ref 150–450)
RBC: 5.07 x10E6/uL (ref 3.77–5.28)
RDW: 12.5 % (ref 11.7–15.4)
WBC: 8.2 10*3/uL (ref 3.4–10.8)

## 2023-03-26 LAB — HIV ANTIBODY (ROUTINE TESTING W REFLEX): HIV Screen 4th Generation wRfx: NONREACTIVE

## 2023-03-26 LAB — HEPATITIS C ANTIBODY: Hep C Virus Ab: NONREACTIVE

## 2023-03-26 LAB — AMYLASE: Amylase: 74 U/L (ref 31–110)

## 2023-03-26 LAB — GAMMA GT: GGT: 30 IU/L (ref 0–60)

## 2023-03-26 NOTE — Progress Notes (Signed)
Labs normal and stable; continue to monitor symptoms.

## 2023-04-05 ENCOUNTER — Telehealth (INDEPENDENT_AMBULATORY_CARE_PROVIDER_SITE_OTHER): Payer: BC Managed Care – PPO | Admitting: Family Medicine

## 2023-04-05 ENCOUNTER — Telehealth: Payer: Self-pay | Admitting: Family Medicine

## 2023-04-05 DIAGNOSIS — H6692 Otitis media, unspecified, left ear: Secondary | ICD-10-CM

## 2023-04-05 DIAGNOSIS — H6690 Otitis media, unspecified, unspecified ear: Secondary | ICD-10-CM

## 2023-04-05 MED ORDER — CEPHALEXIN 500 MG PO CAPS
500.0000 mg | ORAL_CAPSULE | Freq: Four times a day (QID) | ORAL | 0 refills | Status: AC
Start: 2023-04-05 — End: 2023-04-12

## 2023-04-05 NOTE — Telephone Encounter (Signed)
Contacted pharmacy and advised. Verbalized understanding and stated they would note that patient can tolerate the rx.

## 2023-04-05 NOTE — Telephone Encounter (Signed)
Please advise pharmacy I am aware of her pcn allergy. She has taken cephalexin multiple times in the past and never had allergic reaction. Please dispense as previously written

## 2023-04-05 NOTE — Progress Notes (Signed)
I,Sha'taria Brendalee Matthies,acting as a Neurosurgeon for Mila Merry, MD.,have documented all relevant documentation on the behalf of Mila Merry, MD,as directed by  Mila Merry, MD   MyChart Video Visit    Virtual Visit via Video Note   This format is felt to be most appropriate for this patient at this time. Physical exam was limited by quality of the video and audio technology used for the visit.   Patient location: home Provider location: [facility]  I discussed the limitations of evaluation and management by telemedicine and the availability of in person appointments. The patient expressed understanding and agreed to proceed.  Patient: Crystal Higgins   DOB: Dec 15, 1960   62 y.o. Female  MRN: [id number] Visit Date: 04/05/2023  Today's healthcare provider: Mila Merry, MD   No chief complaint on file.  Subjective      Otalgia  There is pain in the left ear. This is a new problem. The current episode started in the past 3 days. The problem occurs constantly. The problem has been unchanged. There has been no fever. The pain is at a severity of 6/10. Associated symptoms include hearing loss. She has tried ear drops (ibuprofen) for the symptoms. The treatment provided mild relief. It is identical to many other ear infections she has had over the last several years which always respond to azithromycin or cephalexin. Was last treated for this at the end of March.   Medications: Outpatient Medications Prior to Visit  Medication Sig   amLODipine (NORVASC) 2.5 MG tablet TAKE 1 TABLET DAILY   Butalbital-APAP-Caffeine 50-300-40 MG CAPS TAKE 1 TO 2 CAPSULES BY MOUTH EVERY 6 HOURS AS NEEDED FOR  HEADACHE   cholecalciferol (VITAMIN D) 1000 UNITS tablet Take by mouth.   CIPRODEX OTIC suspension 4 drops into left ear  twice daily   docusate sodium (COLACE) 100 MG capsule Take 100 mg by mouth daily.   hydrocortisone 2.5 % cream Apply topically 2 (two) times daily.   hydrocortisone  2.5 % cream Apply topically 2 (two) times daily as needed (Rash).   Multiple Vitamin (MULTIVITAMIN WITH MINERALS) TABS tablet Take 1 tablet by mouth daily.   mupirocin ointment (BACTROBAN) 2 % Apply 1 Application topically 2 (two) times daily.   neomycin-polymyxin b-dexamethasone (MAXITROL) 3.5-10000-0.1 OINT Place 1 Application into the left eye in the morning, at noon, and at bedtime.   omeprazole (PRILOSEC) 20 MG capsule TAKE 1 CAPSULE DAILY   pilocarpine (SALAGEN) 5 MG tablet Take 5 mg by mouth 3 (three) times daily.   pimecrolimus (ELIDEL) 1 % cream Apply to affected areas rash twice daily as needed for rash.   polyethylene glycol powder (GLYCOLAX/MIRALAX) 17 GM/SCOOP powder Take 1 Container by mouth once.   Tavaborole 5 % SOLN Apply to affected fingernail nightly.   vitamin B-12 (CYANOCOBALAMIN) 500 MCG tablet Take 500 mcg by mouth every other day.   No facility-administered medications prior to visit.    Review of Systems  HENT:  Positive for ear pain and hearing loss.        Objective    There were no vitals taken for this visit.     Physical Exam   Awake, alert, oriented x 3. In no apparent distress   Assessment & Plan     1. Acute otitis media, unspecified otitis media type Identical to many previous episodes of OM which have responded well to azithromycin and cephalexin.  - cephALEXin (KEFLEX) 500 MG capsule; Take 1 capsule (500 mg total)  by mouth 4 (four) times daily for 7 days.  Dispense: 28 capsule; Refill: 0     I discussed the assessment and treatment plan with the patient. The patient was provided an opportunity to ask questions and all were answered. The patient agreed with the plan and demonstrated an understanding of the instructions.   The patient was advised to call back or seek an in-person evaluation if the symptoms worsen or if the condition fails to improve as anticipated.  I provided 7 minutes of non-face-to-face time during this encounter.  The  entirety of the information documented in the History of Present Illness, Review of Systems and Physical Exam were personally obtained by me. Portions of this information were initially documented by the CMA and reviewed by me for thoroughness and accuracy.    Mila Merry, MD [facility] [phone] (phone) [phone] (fax)  [facility]

## 2023-04-07 ENCOUNTER — Ambulatory Visit: Payer: BC Managed Care – PPO | Admitting: Dermatology

## 2023-04-07 VITALS — BP 130/80 | HR 75

## 2023-04-07 DIAGNOSIS — L814 Other melanin hyperpigmentation: Secondary | ICD-10-CM | POA: Diagnosis not present

## 2023-04-07 DIAGNOSIS — L820 Inflamed seborrheic keratosis: Secondary | ICD-10-CM

## 2023-04-07 DIAGNOSIS — L821 Other seborrheic keratosis: Secondary | ICD-10-CM | POA: Diagnosis not present

## 2023-04-07 NOTE — Progress Notes (Signed)
   Follow-Up Visit   Subjective  Crystal Higgins is a 62 y.o. female who presents for the following: Spot on the left posterior neck. She has used [place] in the past, but hasn't helped. Spot gets bigger and crusty at times, gets rubbed by clothing and has bled. She also has a spot on her right hand she would like checked.   The patient has spots, moles and lesions to be evaluated, some may be new or changing.   The following portions of the chart were reviewed this encounter and updated as appropriate: medications, allergies, medical history  Review of Systems:  No other skin or systemic complaints except as noted in HPI or Assessment and Plan.  Objective  Well appearing patient in no apparent distress; mood and affect are within normal limits.  A focused examination was performed of the following areas: Face, hands, neck Relevant physical exam findings are noted in the Assessment and Plan.  Left Occipital Hairline Light pink firm papule, mild scale, 0.7 CM    Assessment & Plan   Inflamed seborrheic keratosis Left Occipital Hairline  vs Prurigo Nodule  Symptomatic, irritating, patient would like treated.   Avoid picking while healing.   Destruction of lesion - Left Occipital Hairline  Destruction method: cryotherapy   Informed consent: discussed and consent obtained   Lesion destroyed using liquid nitrogen: Yes   Region frozen until ice ball extended beyond lesion: Yes   Outcome: patient tolerated procedure well with no complications   Post-procedure details: wound care instructions given   Additional details:  Prior to procedure, discussed risks of blister formation, small wound, skin dyspigmentation, or rare scar following cryotherapy. Recommend Vaseline ointment to treated areas while healing.   SEBORRHEIC KERATOSIS - Stuck-on, waxy, tan-brown papules and/or plaques, including right hand dorsum  - Benign-appearing - Discussed benign etiology and prognosis. -  Observe - Call for any changes   LENTIGINES Exam: scattered tan macules Due to sun exposure Treatment Plan: Benign-appearing, observe. Recommend daily broad spectrum sunscreen SPF 30+ to sun-exposed areas, reapply every 2 hours as needed.  Call for any changes   Return if symptoms worsen or fail to improve.  ICherlyn Labella, CMA, am acting as scribe for Willeen Niece, MD .   Documentation: I have reviewed the above documentation for accuracy and completeness, and I agree with the above.  Willeen Niece, MD

## 2023-04-07 NOTE — Patient Instructions (Addendum)

## 2023-04-09 DIAGNOSIS — Z1211 Encounter for screening for malignant neoplasm of colon: Secondary | ICD-10-CM | POA: Diagnosis not present

## 2023-04-09 DIAGNOSIS — Z01419 Encounter for gynecological examination (general) (routine) without abnormal findings: Secondary | ICD-10-CM | POA: Diagnosis not present

## 2023-04-09 DIAGNOSIS — N952 Postmenopausal atrophic vaginitis: Secondary | ICD-10-CM | POA: Diagnosis not present

## 2023-04-21 DIAGNOSIS — Z1211 Encounter for screening for malignant neoplasm of colon: Secondary | ICD-10-CM | POA: Diagnosis not present

## 2023-04-22 ENCOUNTER — Other Ambulatory Visit: Payer: Self-pay | Admitting: Family Medicine

## 2023-04-27 ENCOUNTER — Encounter: Payer: Self-pay | Admitting: Family Medicine

## 2023-04-29 ENCOUNTER — Telehealth: Payer: Self-pay

## 2023-04-29 ENCOUNTER — Ambulatory Visit: Payer: Self-pay

## 2023-04-29 DIAGNOSIS — H6690 Otitis media, unspecified, unspecified ear: Secondary | ICD-10-CM

## 2023-04-29 MED ORDER — AZITHROMYCIN 250 MG PO TABS
ORAL_TABLET | ORAL | 0 refills | Status: AC
Start: 2023-04-29 — End: 2023-05-04

## 2023-04-29 NOTE — Telephone Encounter (Signed)
Copied from CRM [id number]. Topic: General - Other >> Apr 29, 2023  8:39 AM Carrielelia G wrote: Patient calling and would like to know if her Mychart message was sen by Dr Sherrie Mustache:    Dr. Sherrie Mustache, I did a video visit with you a couple weeks ago for my ear. I did Keflex. My ear didn't get all the way better. You said to let you know if it didn't work. Should I do a z-pal again or something different. I've been doing the Ciprodex drops, also.  It's painful so thank you for your help    (Also sent a clinical msg to [facility] triage)

## 2023-04-29 NOTE — Telephone Encounter (Signed)
Patient advised.

## 2023-04-29 NOTE — Telephone Encounter (Signed)
Ear pain (2 wks)  still there, took Keflex as prescribed, Also doing ear drops. Can you recommend something else?   Chief Complaint: Visit 04/05/23 with ear pain. Left ear continues to hurt. Finished antibiotic. Asking for additional medication. Symptoms: Above. Pain at night 8/10 Frequency: 2 weeks Pertinent Negatives: Patient denies fever Disposition: [] ED /[] Urgent Care (no appt availability in office) / [] Appointment(In office/virtual)/ []  [facility]/ [] Home Care/ [] Refused Recommended Disposition /[] [facility]/ [x]  Follow-up with PCP Additional Notes: Please advise pt.  Answer Assessment - Initial Assessment Questions 1. LOCATION: "Which ear is involved?"     Left ear 2. ONSET: "When did the ear start hurting"      2 weeks 3. SEVERITY: "How bad is the pain?"  (Scale 1-10; mild, moderate or severe)   - MILD (1-3): doesn't interfere with normal activities    - MODERATE (4-7): interferes with normal activities or awakens from sleep    - SEVERE (8-10): excruciating pain, unable to do any normal activities      8 at night 4. URI SYMPTOMS: "Do you have a runny nose or cough?"     No 5. FEVER: "Do you have a fever?" If Yes, ask: "What is your temperature, how was it measured, and when did it start?"     No 6. CAUSE: "Have you been swimming recently?", "How often do you use Q-TIPS?", "Have you had any recent air travel or scuba diving?"     No 7. OTHER SYMPTOMS: "Do you have any other symptoms?" (e.g., headache, stiff neck, dizziness, vomiting, runny nose, decreased hearing)     No 8. PREGNANCY: "Is there any chance you are pregnant?" "When was your last menstrual period?"     No  Protocols used: Davina Poke

## 2023-04-29 NOTE — Telephone Encounter (Signed)
Have sent prescription azithromycin to [facility]

## 2023-05-04 ENCOUNTER — Telehealth: Payer: Self-pay

## 2023-05-04 ENCOUNTER — Ambulatory Visit
Admission: RE | Admit: 2023-05-04 | Discharge: 2023-05-04 | Disposition: A | Discharge status: Home / Self Care | Payer: BC Managed Care – PPO | Source: Ambulatory Visit | Attending: Family Medicine | Admitting: Family Medicine

## 2023-05-04 DIAGNOSIS — Z1231 Encounter for screening mammogram for malignant neoplasm of breast: Secondary | ICD-10-CM | POA: Insufficient documentation

## 2023-05-04 DIAGNOSIS — G43809 Other migraine, not intractable, without status migrainosus: Secondary | ICD-10-CM

## 2023-05-04 NOTE — Telephone Encounter (Unsigned)
Copied from CRM [id number]. Topic: Referral - Request for Referral >> May 04, 2023  8:37 AM Franchot Heidelberg wrote: Has patient seen PCP for this complaint? Yes.   *If NO, is insurance requiring patient see PCP for this issue before PCP can refer them? Referral for which specialty: Neurology  Preferred provider/office: [facility]  Reason for referral: Headaches, discussed with PCP. Says she was contacted about this a few weeks ago but has not heard anything since.

## 2023-07-02 ENCOUNTER — Telehealth (INDEPENDENT_AMBULATORY_CARE_PROVIDER_SITE_OTHER): Payer: BC Managed Care – PPO | Admitting: Family Medicine

## 2023-07-02 DIAGNOSIS — H6693 Otitis media, unspecified, bilateral: Secondary | ICD-10-CM | POA: Diagnosis not present

## 2023-07-02 MED ORDER — AZITHROMYCIN 250 MG PO TABS
ORAL_TABLET | ORAL | 0 refills | Status: DC
Start: 2023-07-02 — End: 2023-09-03

## 2023-07-02 NOTE — Progress Notes (Signed)
MyChart Video Visit    Virtual Visit via Video Note   This format is felt to be most appropriate for this patient at this time. Physical exam was limited by quality of the video and audio technology used for the visit.   Patient location: home Provider location: [facility]  I discussed the limitations of evaluation and management by telemedicine and the availability of in person appointments. The patient expressed understanding and agreed to proceed.  Patient: Crystal Higgins   DOB: August 18, 1961   62 y.o. Female  MRN: [id number] Visit Date: 07/02/2023  Today's healthcare provider: Mila Merry, MD   Chief Complaint  Patient presents with   Sore Throat    Patient has had sore throat for 5 days.  She has been using Tylenol.  She denies fever, congestion or PND.    Ear Pain    She has had 3 days or bilateral ear pain.  She denies any drainage.  She has chronic tinnitus and hearing loss.     Subjective    Sore Throat  Pertinent negatives include no abdominal pain, shortness of breath or vomiting.   HPI     Sore Throat    Additional comments: Patient has had sore throat for 5 days.  She has been using Tylenol.  She denies fever, congestion or PND.         Ear Pain    Additional comments: She has had 3 days or bilateral ear pain.  She denies any drainage.  She has chronic tinnitus and hearing loss.        Last edited by Adline Peals, CMA on 07/02/2023  8:46 AM.      Identical to previous episodes of otitis which have responded well to azithromycin   Medications: Outpatient Medications Prior to Visit  Medication Sig   amLODipine (NORVASC) 2.5 MG tablet TAKE 1 TABLET DAILY   Butalbital-APAP-Caffeine 50-300-40 MG CAPS TAKE 1 TO 2 CAPSULES BY MOUTH EVERY 6 HOURS AS NEEDED FOR  HEADACHE   cholecalciferol (VITAMIN D) 1000 UNITS tablet Take by mouth.   CIPRODEX OTIC suspension 4 drops into left ear  twice daily   docusate sodium (COLACE) 100 MG capsule Take 100 mg by mouth  daily.   hydrocortisone 2.5 % cream Apply topically 2 (two) times daily.   Multiple Vitamin (MULTIVITAMIN WITH MINERALS) TABS tablet Take 1 tablet by mouth daily.   omeprazole (PRILOSEC) 20 MG capsule TAKE 1 CAPSULE DAILY   pilocarpine (SALAGEN) 5 MG tablet Take 5 mg by mouth 3 (three) times daily.   pimecrolimus (ELIDEL) 1 % cream Apply to affected areas rash twice daily as needed for rash.   polyethylene glycol powder (GLYCOLAX/MIRALAX) 17 GM/SCOOP powder Take 1 Container by mouth once.   vitamin B-12 (CYANOCOBALAMIN) 500 MCG tablet Take 500 mcg by mouth every other day.   [DISCONTINUED] hydrocortisone 2.5 % cream Apply topically 2 (two) times daily as needed (Rash).   [DISCONTINUED] mupirocin ointment (BACTROBAN) 2 % Apply 1 Application topically 2 (two) times daily.   [DISCONTINUED] neomycin-polymyxin b-dexamethasone (MAXITROL) 3.5-10000-0.1 OINT Place 1 Application into the left eye in the morning, at noon, and at bedtime.   [DISCONTINUED] Tavaborole 5 % SOLN Apply to affected fingernail nightly.   No facility-administered medications prior to visit.    Review of Systems  Constitutional:  Negative for appetite change, chills, fatigue and fever.  Respiratory:  Negative for chest tightness and shortness of breath.   Cardiovascular:  Negative for chest pain and palpitations.  Gastrointestinal:  Negative for abdominal pain, nausea and vomiting.  Neurological:  Negative for dizziness and weakness.     Objective    There were no vitals taken for this visit.  Physical Exam  Awake, alert, oriented x 3. In no apparent distress    Assessment & Plan     1. Bilateral otitis media, unspecified otitis media type  - azithromycin (ZITHROMAX) 250 MG tablet; Take 2 tablets on day 1, then 1 tablet daily on days 2 through 5  Dispense: 6 tablet; Refill: 0        I discussed the assessment and treatment plan with the patient. The patient was provided an opportunity to ask questions and all  were answered. The patient agreed with the plan and demonstrated an understanding of the instructions.   The patient was advised to call back or seek an in-person evaluation if the symptoms worsen or if the condition fails to improve as anticipated.  I provided 5 minutes of non-face-to-face time during this encounter.  The entirety of the information documented in the History of Present Illness, Review of Systems and Physical Exam were personally obtained by me. Portions of this information were initially documented by the CMA and reviewed by me for thoroughness and accuracy.    Mila Merry, MD [facility] [phone] (phone) [phone] (fax)  [facility]

## 2023-07-15 DIAGNOSIS — H9203 Otalgia, bilateral: Secondary | ICD-10-CM | POA: Diagnosis not present

## 2023-07-15 DIAGNOSIS — G43E19 Chronic migraine with aura, intractable, without status migrainosus: Secondary | ICD-10-CM | POA: Diagnosis not present

## 2023-07-21 DIAGNOSIS — R682 Dry mouth, unspecified: Secondary | ICD-10-CM | POA: Diagnosis not present

## 2023-07-21 DIAGNOSIS — R768 Other specified abnormal immunological findings in serum: Secondary | ICD-10-CM | POA: Diagnosis not present

## 2023-08-10 DIAGNOSIS — G43E19 Chronic migraine with aura, intractable, without status migrainosus: Secondary | ICD-10-CM | POA: Diagnosis not present

## 2023-09-03 ENCOUNTER — Telehealth: Payer: BC Managed Care – PPO | Admitting: Family Medicine

## 2023-09-03 DIAGNOSIS — H6693 Otitis media, unspecified, bilateral: Secondary | ICD-10-CM

## 2023-09-03 MED ORDER — AZITHROMYCIN 250 MG PO TABS: ORAL_TABLET | ORAL | 0 refills | Status: DC

## 2023-09-03 NOTE — Progress Notes (Signed)
MyChart Video Visit    Virtual Visit via Video Note   This format is felt to be most appropriate for this patient at this time. Physical exam was limited by quality of the video and audio technology used for the visit.   Patient location: home Provider location: [facility]  I discussed the limitations of evaluation and management by telemedicine and the availability of in person appointments. The patient expressed understanding and agreed to proceed.  Patient: Crystal Higgins   DOB: 01/06/1961   62 y.o. Female  MRN: [id number] Visit Date: 09/03/2023  Today's healthcare provider: Mila Merry, MD   No chief complaint on file.  Subjective    Discussed the use of AI scribe software for clinical note transcription with the patient, who gave verbal consent to proceed.  History of Present Illness   The patient, known to have recurrent left ear infections, presents with bilateral ear discomfort for over a week. The right ear symptoms started more recently, on Monday. The patient denies any associated fever, chills, or sweats. She has not been taking any allergy medications due to adverse effects, including oral dryness.  The patient has a history of responding well to Zithromax for similar symptoms, with the last course taken approximately two months ago. She expresses concern about the potential for developing resistance to Zithromax, as other treatments have not been as effective in the past. However, she acknowledges that Zithromax is currently still effective for her.       Medications: Outpatient Medications Prior to Visit  Medication Sig   amLODipine (NORVASC) 2.5 MG tablet TAKE 1 TABLET DAILY   Butalbital-APAP-Caffeine 50-300-40 MG CAPS TAKE 1 TO 2 CAPSULES BY MOUTH EVERY 6 HOURS AS NEEDED FOR  HEADACHE   cholecalciferol (VITAMIN D) 1000 UNITS tablet Take by mouth.   CIPRODEX OTIC suspension 4 drops into left ear  twice daily   docusate sodium (COLACE) 100 MG capsule Take 100 mg by  mouth daily.   hydrocortisone 2.5 % cream Apply topically 2 (two) times daily.   Multiple Vitamin (MULTIVITAMIN WITH MINERALS) TABS tablet Take 1 tablet by mouth daily.   omeprazole (PRILOSEC) 20 MG capsule TAKE 1 CAPSULE DAILY   pilocarpine (SALAGEN) 5 MG tablet Take 5 mg by mouth 3 (three) times daily.   pimecrolimus (ELIDEL) 1 % cream Apply to affected areas rash twice daily as needed for rash.   polyethylene glycol powder (GLYCOLAX/MIRALAX) 17 GM/SCOOP powder Take 1 Container by mouth once.   vitamin B-12 (CYANOCOBALAMIN) 500 MCG tablet Take 500 mcg by mouth every other day.   No facility-administered medications prior to visit.      Objective    There were no vitals taken for this visit.    Physical Exam  Awake, alert, oriented x 3. In no apparent distress       Assessment & Plan        Recurrent Otitis Media Bilateral ear pain for over a week, worse on the left. No fever, chills, or sweats. Last treated with Zithromax two months ago with good response.  -Prescribe Zithromax, send prescription to [facility] on [address].    No follow-ups on file.     I discussed the assessment and treatment plan with the patient. The patient was provided an opportunity to ask questions and all were answered. The patient agreed with the plan and demonstrated an understanding of the instructions.   The patient was advised to call back or seek an in-person evaluation if the symptoms  worsen or if the condition fails to improve as anticipated.  I provided 8 minutes of non-face-to-face time during this encounter.    Mila Merry, MD [facility] [phone] (phone) [phone] (fax)  [facility]

## 2023-11-05 ENCOUNTER — Other Ambulatory Visit: Payer: Self-pay | Admitting: Family Medicine

## 2023-11-05 DIAGNOSIS — I10 Essential (primary) hypertension: Secondary | ICD-10-CM

## 2023-11-05 MED ORDER — AMLODIPINE BESYLATE 2.5 MG PO TABS
2.5000 mg | ORAL_TABLET | Freq: Every day | ORAL | 0 refills | Status: DC
Start: 2023-11-05 — End: 2024-01-31

## 2023-11-05 NOTE — Telephone Encounter (Signed)
Medication Refill -  Most Recent Primary Care Visit:  Provider: Malva Limes  Department: [facility]  Visit Type: MYCHART VIDEO VISIT  Date: 09/03/2023  Medication: amLODipine (NORVASC) 2.5 MG tablet [[id number]]   Has the patient contacted their pharmacy? Yes  (Agent: If yes, when and what did the pharmacy advise?) Reach out to office for refill   Is this the correct pharmacy for this prescription? Yes  This is the patient's preferred pharmacy:   [facility] [address], [place] - [address] [place] [address] Phone: [phone] Fax: [phone]    Has the prescription been filled recently? Yes  Is the patient out of the medication? No  Has the patient been seen for an appointment in the last year OR does the patient have an upcoming appointment? Yes  Can we respond through MyChart? Yes  Agent: Please be advised that Rx refills may take up to 3 business days. We ask that you follow-up with your pharmacy.

## 2023-11-05 NOTE — Telephone Encounter (Signed)
Requested Prescriptions  Pending Prescriptions Disp Refills   amLODipine (NORVASC) 2.5 MG tablet 90 tablet 0    Sig: Take 1 tablet (2.5 mg total) by mouth daily.     Cardiovascular: Calcium Channel Blockers 2 Passed - 11/05/2023  2:00 PM      Passed - Last BP in normal range    BP Readings from Last 1 Encounters:  04/07/23 130/80         Passed - Last Heart Rate in normal range    Pulse Readings from Last 1 Encounters:  04/07/23 75         Passed - Valid encounter within last 6 months    Recent Outpatient Visits           2 months ago Bilateral otitis media, unspecified otitis media type   [facility] Malva Limes, MD   4 months ago Bilateral otitis media, unspecified otitis media type   [facility] Malva Limes, MD   7 months ago Acute otitis media, unspecified otitis media type   [facility] Malva Limes, MD   7 months ago Radiating abdominal pain   [facility] Jacky Kindle, FNP   9 months ago Annual physical exam   [facility] Sherrie Mustache, Demetrios Isaacs, MD

## 2023-11-15 ENCOUNTER — Telehealth: Payer: BC Managed Care – PPO | Admitting: Family Medicine

## 2023-11-15 DIAGNOSIS — H6690 Otitis media, unspecified, unspecified ear: Secondary | ICD-10-CM

## 2023-11-15 DIAGNOSIS — H6693 Otitis media, unspecified, bilateral: Secondary | ICD-10-CM

## 2023-11-15 MED ORDER — AZITHROMYCIN 250 MG PO TABS
ORAL_TABLET | ORAL | 0 refills | Status: AC
Start: 2023-11-15 — End: 2023-11-20

## 2023-11-15 NOTE — Progress Notes (Signed)
      Established patient visit   Patient: Crystal Higgins   DOB: [birth year]   62 y.o. Female  MRN: [id number] Visit Date: 11/15/2023  Today's healthcare provider: Mila Merry, MD   No chief complaint on file.  Subjective    Discussed the use of AI scribe software for clinical note transcription with the patient, who gave verbal consent to proceed.  History of Present Illness   The patient, with a history of recurrent ear infections, presents with worsening ear pain over the past two weeks. Initially, she attempted self-management with ear drops and over-the-counter analgesics, which provided temporary relief. However, the pain has intensified in recent days, disrupting her sleep. The patient has a history of successful treatment with Zithromax for similar symptoms in October. She has been unable to use antihistamines due to an unspecified oral condition. The patient's pharmacy is the [facility] on [address].       Medications: Outpatient Medications Prior to Visit  Medication Sig   amLODipine (NORVASC) 2.5 MG tablet Take 1 tablet (2.5 mg total) by mouth daily.   Butalbital-APAP-Caffeine 50-300-40 MG CAPS TAKE 1 TO 2 CAPSULES BY MOUTH EVERY 6 HOURS AS NEEDED FOR  HEADACHE   cholecalciferol (VITAMIN D) 1000 UNITS tablet Take by mouth.   CIPRODEX OTIC suspension 4 drops into left ear  twice daily   docusate sodium (COLACE) 100 MG capsule Take 100 mg by mouth daily.   hydrocortisone 2.5 % cream Apply topically 2 (two) times daily.   Multiple Vitamin (MULTIVITAMIN WITH MINERALS) TABS tablet Take 1 tablet by mouth daily.   omeprazole (PRILOSEC) 20 MG capsule TAKE 1 CAPSULE DAILY   pilocarpine (SALAGEN) 5 MG tablet Take 5 mg by mouth 3 (three) times daily.   pimecrolimus (ELIDEL) 1 % cream Apply to affected areas rash twice daily as needed for rash.   polyethylene glycol powder (GLYCOLAX/MIRALAX) 17 GM/SCOOP powder Take 1 Container by mouth once.   vitamin B-12 (CYANOCOBALAMIN) 500 MCG  tablet Take 500 mcg by mouth every other day.   No facility-administered medications prior to visit.   Review of Systems     Objective    There were no vitals taken for this visit.  Physical Exam  Awake, alert, oriented x 3. In no apparent distress       Assessment & Plan       Recurrent Otitis Externa Painful for 2 weeks, initially improved with Ciprodex drops but worsened after discontinuation. No mention of discharge, hearing loss, or vertigo. -Start Zithromax, as it was effective in October and there is no recent antibiotic use that would suggest resistance. -If not improved by the end of the week, reassess.    No follow-ups on file.      Mila Merry, MD  [facility] [phone] (phone) [phone] (fax)  [facility]

## 2024-01-06 ENCOUNTER — Encounter: Payer: Self-pay | Admitting: Family Medicine

## 2024-01-06 ENCOUNTER — Telehealth: Payer: BC Managed Care – PPO | Admitting: Family Medicine

## 2024-01-06 DIAGNOSIS — J029 Acute pharyngitis, unspecified: Secondary | ICD-10-CM

## 2024-01-06 DIAGNOSIS — H6690 Otitis media, unspecified, unspecified ear: Secondary | ICD-10-CM

## 2024-01-06 MED ORDER — DOXYCYCLINE MONOHYDRATE 100 MG PO CAPS: 100.0000 mg | ORAL_CAPSULE | Freq: Two times a day (BID) | ORAL | 0 refills | Status: AC

## 2024-01-06 NOTE — Progress Notes (Signed)
MyChart Video Visit    Virtual Visit via Video Note   This format is felt to be most appropriate for this patient at this time. Physical exam was limited by quality of the video and audio technology used for the visit.    Patient location: home Provider location: [facility] Persons involved in the visit: patient, provider  I discussed the limitations of evaluation and management by telemedicine and the availability of in person appointments. The patient expressed understanding and agreed to proceed.  Patient: Crystal Higgins   DOB: Sep 05, 1961   63 y.o. Female  MRN: [id number] Visit Date: 01/06/2024  Today's healthcare provider: Shirlee Latch, MD   No chief complaint on file.  Subjective    HPI   Discussed the use of AI scribe software for clinical note transcription with the patient, who gave verbal consent to proceed.  History of Present Illness   The patient, with a history of recurrent tonsillitis and ear infections, presents with a sore throat that started a few days ago. She describes the pain as severe and localized to the left tonsil, which is significantly swollen. She also reports ear pain, which she attributes to her chronic tonsillitis. She noticed some white spots on the tonsil but was unsure of their significance. The patient also reports nasal congestion, which started after the onset of the sore throat, a reversal of her usual symptom progression. She has seen multiple specialists for her recurrent ear infections, which started 6-7 years ago, but has not found a definitive solution. She has a known allergy to penicillins and doxycycline, but has tolerated monohydrate doxycycline in the past.        Review of Systems      Objective    There were no vitals taken for this visit.      Physical Exam Constitutional:      General: She is not in acute distress.    Appearance: Normal appearance.  HENT:     Head: Normocephalic.   Pulmonary:     Effort: Pulmonary effort is normal. No respiratory distress.  Neurological:     Mental Status: She is alert and oriented to person, place, and time. Mental status is at baseline.        Assessment & Plan     Problem List Items Addressed This Visit   None Visit Diagnoses       Acute otitis media, unspecified otitis media type    -  Primary   Relevant Medications   doxycycline (MONODOX) 100 MG capsule     Pharyngitis, unspecified etiology           Assessment and Plan    Tonsillitis Acute tonsillitis with left tonsil swelling and white spots. Severe sore throat since Wednesday, with nasal congestion. Recurrent tonsillitis. Discussed antibiotic resistance with azithromycin. Prefers doxycycline monohydrate due to better tolerance. - Prescribe doxycycline monohydrate 100 mg BID for 7 days - Advise taking medication with food to reduce nausea - Follow up in person if symptoms do not improve or recur  Recurrent Ear Infections Recurrent ear infections with recent episodes in October and December. Ear pain associated with current tonsillitis. Discussed typical treatment progression and previous specialist consultations. - Monitor for improvement with current antibiotic treatment - Advise in-person visit if symptoms persist or worsen  Medication Allergies Allergic to penicillins and doxycycline (except monohydrate form). Previous successful use of azithromycin but concern for resistance. Prefers doxycycline monohydrate due to better tolerance. - Avoid penicillins and standard  doxycycline - Use doxycycline monohydrate as alternative.         Meds ordered this encounter  Medications   doxycycline (MONODOX) 100 MG capsule    Sig: Take 1 capsule (100 mg total) by mouth 2 (two) times daily for 7 days.    Dispense:  14 capsule    Refill:  0     No follow-ups on file.     I discussed the assessment and treatment plan with the patient. The patient was provided  an opportunity to ask questions and all were answered. The patient agreed with the plan and demonstrated an understanding of the instructions.   The patient was advised to call back or seek an in-person evaluation if the symptoms worsen or if the condition fails to improve as anticipated.   Shirlee Latch, MD [facility] [phone] (phone) [phone] (fax)  [facility]

## 2024-01-19 ENCOUNTER — Ambulatory Visit: Payer: Self-pay | Admitting: Family Medicine

## 2024-01-19 DIAGNOSIS — H6690 Otitis media, unspecified, unspecified ear: Secondary | ICD-10-CM

## 2024-01-19 NOTE — Telephone Encounter (Signed)
 Attempt # 2. Call could not be completed, received this message, "Call could not be completed as dialed." Will continue to attempt.

## 2024-01-19 NOTE — Telephone Encounter (Signed)
 Attempted to call patient. She advised that she never received these phone calls mentioned.  She has however called and spoken with triage who routed a separate encounter for this problem. Will continue documentation in that encounter and close this duplicate.

## 2024-01-19 NOTE — Telephone Encounter (Signed)
 Final attempt (#3): Call could not be completed, received this message, "Call could not be completed as dialed." Will route to practice

## 2024-01-19 NOTE — Telephone Encounter (Signed)
 Chief Complaint: Left ear pain Symptoms: Left ear pain, worsened at night Frequency: two weeks Pertinent Negatives: Patient denies fever, URI symptoms Disposition: [] ED /[] Urgent Care (no appt availability in office) / [x] Appointment(In office/virtual)/ []  [place] [facility]/ [] Home Care/ [x] Refused Recommended Disposition /[] [place] [facility]/ []  Follow-up with PCP Additional Notes: Patient called in stating she had a video visit roughly two weeks ago about ear pain and was prescribed a 14 day cycle of Doxycycline that she has completed. Patient states her ear felt better until Sunday night and the symptoms returned and are worsened. Patient states during the day the ear pain is 5-6 and at night it is unbearable. Advised patient to be seen in office for HCP to look inside ear and do further evaluation but patient states she cannot drive into office because she cannot turn her head and is concerned with the ear discomfort, and she does not have an adult who can drive her. Patient would like message passed through to Dr. Sherrie Mustache in regard to if another antibiotic needs to be prescribed or what to do. Patient states we can contact her via phone or mychart.   Reason for Disposition  Earache  (Exceptions: brief ear pain of < 60 minutes duration, earache occurring during air travel    No air travel but completed full round of Doxycycline and still has severe pain  Answer Assessment - Initial Assessment Questions 1. LOCATION: "Which ear is involved?"     Left ear  2. ONSET: "When did the ear start hurting"      Roughly two weeks - felt like it was getting better but Sunday night it came back and got worse 3. SEVERITY: "How bad is the pain?"  (Scale 1-10; mild, moderate or severe)   - MILD (1-3): doesn't interfere with normal activities    - MODERATE (4-7): interferes with normal activities or awakens from sleep    - SEVERE (8-10): excruciating pain, unable to do any normal activities       During day 5, at night its so bad patient can't sleep 4. URI SYMPTOMS: "Do you have a runny nose or cough?"     No 5. FEVER: "Do you have a fever?" If Yes, ask: "What is your temperature, how was it measured, and when did it start?"     No 6. CAUSE: "Have you been swimming recently?", "How often do you use Q-TIPS?", "Have you had any recent air travel or scuba diving?"     no 7. OTHER SYMPTOMS: "Do you have any other symptoms?" (e.g., headache, stiff neck, dizziness, vomiting, runny nose, decreased hearing)     No new symptoms  Protocols used: Earache-A-AH

## 2024-01-19 NOTE — Telephone Encounter (Signed)
  1st attempt - received "call cannot be completed as dialed"   Copied from CRM [id number]. Topic: Clinical - Medical Advice >> Jan 19, 2024  7:54 AM Everette C wrote: Reason for CRM: The patient shares that they have ongoing left ear discomfort and concerns   The patient has been previously seen for their concern but still experiences significant pressure and discomfort in the evenings especially    The patient would like to be prescribed Azithromycin for their concern and to discuss their left ear discomfort further when possible

## 2024-01-20 MED ORDER — AZITHROMYCIN 250 MG PO TABS
ORAL_TABLET | ORAL | 0 refills | Status: DC
Start: 2024-01-20 — End: 2024-05-04

## 2024-01-20 NOTE — Telephone Encounter (Signed)
 Can try a round of azithromycin, have sent prescription to [facility]

## 2024-01-20 NOTE — Addendum Note (Signed)
 Addended by: Malva Limes on: 01/20/2024 09:42 AM   Modules accepted: Orders

## 2024-01-24 DIAGNOSIS — R682 Dry mouth, unspecified: Secondary | ICD-10-CM | POA: Diagnosis not present

## 2024-01-24 DIAGNOSIS — R768 Other specified abnormal immunological findings in serum: Secondary | ICD-10-CM | POA: Diagnosis not present

## 2024-01-31 ENCOUNTER — Ambulatory Visit: Payer: BC Managed Care – PPO | Admitting: Family Medicine

## 2024-01-31 ENCOUNTER — Encounter: Payer: Self-pay | Admitting: Family Medicine

## 2024-01-31 VITALS — BP 127/76 | HR 66 | Resp 16 | Ht 63.0 in | Wt 124.7 lb

## 2024-01-31 DIAGNOSIS — G43809 Other migraine, not intractable, without status migrainosus: Secondary | ICD-10-CM

## 2024-01-31 DIAGNOSIS — I10 Essential (primary) hypertension: Secondary | ICD-10-CM

## 2024-01-31 DIAGNOSIS — Z0000 Encounter for general adult medical examination without abnormal findings: Secondary | ICD-10-CM

## 2024-01-31 DIAGNOSIS — H6992 Unspecified Eustachian tube disorder, left ear: Secondary | ICD-10-CM

## 2024-01-31 DIAGNOSIS — Z8249 Family history of ischemic heart disease and other diseases of the circulatory system: Secondary | ICD-10-CM | POA: Insufficient documentation

## 2024-01-31 DIAGNOSIS — R768 Other specified abnormal immunological findings in serum: Secondary | ICD-10-CM | POA: Insufficient documentation

## 2024-01-31 MED ORDER — AMLODIPINE BESYLATE 2.5 MG PO TABS: 2.5000 mg | ORAL_TABLET | Freq: Every day | ORAL | 3 refills | Status: AC

## 2024-01-31 NOTE — Progress Notes (Unsigned)
 Complete physical exam   Patient: Crystal Higgins   DOB: 1961-08-10   63 y.o. Female  MRN: [id number] Visit Date: 01/31/2024  Today's healthcare provider: Mila Merry, MD   Chief Complaint  Patient presents with   Annual Exam   Hypertension   Subjective    Discussed the use of AI scribe software for clinical note transcription with the patient, who gave verbal consent to proceed.  History of Present Illness   The patient presents for an annual physical exam.  She has a history of migraines and has been seen by a neurologist who re-prescribed Nurtec to be taken as needed. She takes it a couple of times a week but experiences rebound headaches the day after taking it. The day she takes Nurtec is good, but the following day is 'always horrible.'  She experiences a dry mouth that extends pain to her throat, which worsens with preventative medications, making them intolerable. Due to fear of worsening symptoms, she has not started a new preventative medication.  She discusses a conversation with her rheumatologist who implied she might have lupus, although it has not been explicitly diagnosed. She experiences symptoms such as joint swelling, rashes on her face, and chronic ear infections, which she associates with lupus. She mentions a cousin with lupus and has been advised to watch for symptoms like fingers turning blue and joint swelling.   She is currently taking amlodipine and omeprazole, with plans to refill these medications soon. She has a family history of heart problems, with both parents having had heart issues. Her mother passed away in 03-03-2015 following an aortic valve replacement, and her father had heart problems from a young age. She had a heart attack at 78 months old, reportedly due to a penicillin reaction.  No current trouble with breathing or feeling short of breath. No swelling in her hands or feet, except for joint swelling. She experiences dizziness when her ear  issues are severe.     Lab Results  Component Value Date   CHOL 218 (H) 02/01/2023   HDL 54 02/01/2023   LDLCALC 133 (H) 02/01/2023   TRIG 173 (H) 02/01/2023   CHOLHDL 4.0 02/01/2023       Past Medical History:  Diagnosis Date   Endometriosis    GERD (gastroesophageal reflux disease)    Headache    Hemorrhoid    Migraines    Myocardial infarction [facility]) 1963   age 6 months because of penicillin   Parotiditis 05/29/2015   Subacromial bursitis 10/11/2015   Past Surgical History:  Procedure Laterality Date   ABDOMINAL HYSTERECTOMY  05/07/2008   Partial due to Fibroids   CESAREAN SECTION  1987, 1992, 1994   x3   HEMORRHOID SURGERY N/A 05/07/2015   Procedure: HEMORRHOIDECTOMY PROLAPSED;  Surgeon: Nadeen Landau, MD;  Location: [facility] ORS;  Service: General;  Laterality: N/A;   LYSIS OF ADHESION  1990   OOPHORECTOMY  04/2012   unilateral   TUBAL LIGATION     Social History   Socioeconomic History   Marital status: Married    Spouse name: Not on file   Number of children: 3   Years of education: 12   Highest education level: Not on file  Occupational History    Employer:  [employer]    Comment: works in Development worker, community at [employer]  Tobacco Use   Smoking status: Never   Smokeless tobacco: Never  Substance and Sexual Activity   Alcohol use: No  Drug use: No   Sexual activity: Yes    Birth control/protection: None  Other Topics Concern   Not on file  Social History Narrative   Not on file   Social Drivers of Health   Financial Resource Strain: Not on file  Food Insecurity: Not on file  Transportation Needs: Not on file  Physical Activity: Not on file  Stress: Not on file  Social Connections: Not on file  Intimate Partner Violence: Not on file   Family Status  Relation Name Status   Mother  Deceased   Father  Deceased       Cause of death: Renal Failure   Sister  Deceased at age 3       Cause of Death: Drowning   Sister   Deceased at age 5       Cause of death: Lung Cancer   Brother  Alive   Daughter  Alive   Son  Alive   Son  Alive   Neg Hx  (Not Specified)  No partnership data on file   Family History  Problem Relation Age of Onset   CAD Mother    Hypertension Mother    Diabetes Mother        type 2   Colon cancer Mother 51   CAD Father    Heart attack Father    Kidney failure Father    Allergies Daughter    Allergies Son    Breast cancer Neg Hx    Allergies  Allergen Reactions   Amitriptyline    Codeine Nausea And Vomiting   Doxycycline Nausea Only   Nortriptyline    Penicillins Other (See Comments)    Heart failure @ 15 months old   Levaquin [Levofloxacin] Itching and Other (See Comments)    Flushing, Burning    Patient Care Team: Malva Limes, MD as PCP - General (Family Medicine) Nancy Marus, MD (Dermatology) Schermerhorn, Ihor Austin, MD as Referring Physician (Obstetrics and Gynecology) Stanton Kidney, MD as Consulting Physician (Gastroenterology)   Medications: Outpatient Medications Prior to Visit  Medication Sig   Butalbital-APAP-Caffeine 50-300-40 MG CAPS TAKE 1 TO 2 CAPSULES BY MOUTH EVERY 6 HOURS AS NEEDED FOR  HEADACHE   cholecalciferol (VITAMIN D) 1000 UNITS tablet Take by mouth.   CIPRODEX OTIC suspension 4 drops into left ear  twice daily   docusate sodium (COLACE) 100 MG capsule Take 100 mg by mouth daily.   hydrocortisone 2.5 % cream Apply topically 2 (two) times daily.   Multiple Vitamin (MULTIVITAMIN WITH MINERALS) TABS tablet Take 1 tablet by mouth daily.   omeprazole (PRILOSEC) 20 MG capsule TAKE 1 CAPSULE DAILY   pilocarpine (SALAGEN) 5 MG tablet Take 5 mg by mouth 3 (three) times daily.   pimecrolimus (ELIDEL) 1 % cream Apply to affected areas rash twice daily as needed for rash.   polyethylene glycol powder (GLYCOLAX/MIRALAX) 17 GM/SCOOP powder Take 1 Container by mouth once.   vitamin B-12 (CYANOCOBALAMIN) 500 MCG tablet Take 500 mcg by mouth  every other day.   amLODipine (NORVASC) 2.5 MG tablet Take 1 tablet (2.5 mg total) by mouth daily.   NURTEC 75 MG TBDP Take 1 tablet by mouth as needed.   No facility-administered medications prior to visit.    Review of Systems  Constitutional:  Negative for appetite change, chills, fatigue and fever.  Respiratory:  Negative for chest tightness and shortness of breath.   Cardiovascular:  Negative for chest pain and palpitations.  Gastrointestinal:  Negative for abdominal pain, nausea and vomiting.  Neurological:  Negative for dizziness and weakness.   {Insert previous labs (optional):23779} {See past labs  Heme  Chem  Endocrine  Serology  Results Review (optional):1}  Objective    BP 127/76 (BP Location: Left Arm, Patient Position: Sitting, Cuff Size: Normal)   Pulse 66   Resp 16   Ht 5\' 3"  (1.6 m)   Wt 124 lb 11.2 oz (56.6 kg)   SpO2 100%   BMI 22.09 kg/m  {Insert last BP/Wt (optional):23777}{See vitals history (optional):1}  Physical Exam  General Appearance:    Well developed, well nourished female. Alert, cooperative, in no acute distress, appears stated age   Head:    Normocephalic, without obvious abnormality, atraumatic  Eyes:    PERRL, conjunctiva/corneas clear, EOM's intact, fundi    benign, both eyes  Ears:    Normal TM's and external ear canals, both ears  Nose:   Nares normal, septum midline, mucosa normal, no drainage    or sinus tenderness  Throat:   Lips, mucosa, and tongue normal; teeth and gums normal  Neck:   Supple, symmetrical, trachea midline, no adenopathy;    thyroid:  no enlargement/tenderness/nodules; no carotid   bruit or JVD  Back:     Symmetric, no curvature, ROM normal, no CVA tenderness  Lungs:     Clear to auscultation bilaterally, respirations unlabored  Chest Wall:    No tenderness or deformity   Heart:    Normal heart rate. Normal rhythm. No murmurs, rubs, or gallops.   Breast Exam:    deferred  Abdomen:     Soft, non-tender, bowel  sounds active all four quadrants,    no masses, no organomegaly  Pelvic:    deferred  Extremities:   All extremities are intact. No cyanosis or edema  Pulses:   2+ and symmetric all extremities  Skin:   Skin color, texture, turgor normal, no rashes or lesions  Lymph nodes:   Cervical, supraclavicular, and axillary nodes normal  Neurologic:   CNII-XII intact, normal strength, sensation and reflexes    throughout       Last depression screening scores    01/31/2024   11:10 AM 03/25/2023    9:51 AM 02/01/2023    2:04 PM  PHQ 2/9 Scores  PHQ - 2 Score 0 0 0  PHQ- 9 Score  0 0   Last fall risk screening    01/31/2024   11:10 AM  Fall Risk   Falls in the past year? 0  Number falls in past yr: 0  Injury with Fall? 0  Risk for fall due to : No Fall Risks   Last Audit-C alcohol use screening    03/25/2023    9:51 AM  Alcohol Use Disorder Test (AUDIT)  1. How often do you have a drink containing alcohol? 0  3. How often do you have six or more drinks on one occasion? 0   A score of 3 or more in women, and 4 or more in men indicates increased risk for alcohol abuse, EXCEPT if all of the points are from question 1   No results found for any visits on 01/31/24.  Assessment & Plan    Routine Health Maintenance and Physical Exam  Exercise Activities and Dietary recommendations  Goals   None     Immunization History  Administered Date(s) Administered   Tdap 05/23/2010, 02/01/2023    Health Maintenance  Topic Date Due   COVID-19 Vaccine (  1) Never done   Zoster Vaccines- Shingrix (1 of 2) Never done   INFLUENZA VACCINE  02/21/2024 (Originally 06/24/2023)   MAMMOGRAM  05/03/2025   Cervical Cancer Screening (HPV/Pap Cotest)  03/18/2026   Colonoscopy  08/08/2031   DTaP/Tdap/Td (3 - Td or Tdap) 01/31/2033   Hepatitis C Screening  Completed   HIV Screening  Completed   HPV VACCINES  Aged Out    Discussed health benefits of physical activity, and encouraged her to engage in  regular exercise appropriate for her age and condition.     Migraine Experiencing migraines with rebound headaches from Nurtec. Preventative medications cause dry mouth and throat pain, leading to reluctance in starting new preventatives. - Continue management as per neurology, Dr. Sherryll Burger.  - Continue Nurtec as needed, avoid frequent use to prevent rebound headaches. - Consider alternative migraine management strategies due to side effects of preventatives.  Elevate ANA, possible autoimmune disease.  Rheumatologist suggested possible lupus based on symptoms. No explicit diagnosis given. Symptoms align with lupus, but criteria not fully met. - Monitor for symptoms such as joint swelling and rashes. - Discuss with rheumatologist for further evaluation and management.  Hypertension On amlodipine, generally well-controlled. Occasional home readings in the 130s. Family history of heart disease, borderline cholesterol levels warrant further investigation. - Refill amlodipine prescription. - Check additional cholesterol markers such as LPA and ApoB due to family history of heart disease.  Gastroesophageal Reflux Disease (GERD) On omeprazole. - Refill omeprazole prescription.     Family history CAD in both parents.  Check lipids, lp(a) and apo(b)       Mila Merry, MD  [facility] [phone] (phone) [phone] (fax)  [facility]

## 2024-02-01 ENCOUNTER — Encounter: Payer: Self-pay | Admitting: Family Medicine

## 2024-02-02 LAB — CBC
Hematocrit: 42 % (ref 34.0–46.6)
Hemoglobin: 13.8 g/dL (ref 11.1–15.9)
MCH: 28.7 pg (ref 26.6–33.0)
MCHC: 32.9 g/dL (ref 31.5–35.7)
MCV: 87 fL (ref 79–97)
Platelets: 394 10*3/uL (ref 150–450)
RBC: 4.81 x10E6/uL (ref 3.77–5.28)
RDW: 12.4 % (ref 11.7–15.4)
WBC: 9.5 10*3/uL (ref 3.4–10.8)

## 2024-02-02 LAB — LIPID PANEL
Chol/HDL Ratio: 4.4 ratio (ref 0.0–4.4)
Cholesterol, Total: 216 mg/dL — ABNORMAL HIGH (ref 100–199)
HDL: 49 mg/dL (ref >39)
LDL Chol Calc (NIH): 143 mg/dL — ABNORMAL HIGH (ref 0–99)
Triglycerides: 132 mg/dL (ref 0–149)
VLDL Cholesterol Cal: 24 mg/dL (ref 5–40)

## 2024-02-02 LAB — LIPOPROTEIN A (LPA)

## 2024-02-02 LAB — COMPREHENSIVE METABOLIC PANEL
ALT: 12 IU/L (ref 0–32)
AST: 17 IU/L (ref 0–40)
Albumin: 4.4 g/dL (ref 3.9–4.9)
Alkaline Phosphatase: 72 IU/L (ref 44–121)
BUN/Creatinine Ratio: 15 (ref 12–28)
BUN: 11 mg/dL (ref 8–27)
Bilirubin Total: 0.2 mg/dL (ref 0.0–1.2)
CO2: 22 mmol/L (ref 20–29)
Calcium: 9.7 mg/dL (ref 8.7–10.3)
Chloride: 105 mmol/L (ref 96–106)
Creatinine, Ser: 0.73 mg/dL (ref 0.57–1.00)
Globulin, Total: 2 g/dL (ref 1.5–4.5)
Glucose: 88 mg/dL (ref 70–99)
Potassium: 3.7 mmol/L (ref 3.5–5.2)
Sodium: 144 mmol/L (ref 134–144)
Total Protein: 6.4 g/dL (ref 6.0–8.5)
eGFR: 93 mL/min/{1.73_m2} (ref >59)

## 2024-02-02 LAB — APOLIPOPROTEIN B: Apolipoprotein B: 109 mg/dL — ABNORMAL HIGH (ref <90)

## 2024-02-03 ENCOUNTER — Encounter: Payer: Self-pay | Admitting: Family Medicine

## 2024-02-03 ENCOUNTER — Other Ambulatory Visit: Payer: Self-pay | Admitting: Family Medicine

## 2024-02-03 DIAGNOSIS — Z8249 Family history of ischemic heart disease and other diseases of the circulatory system: Secondary | ICD-10-CM

## 2024-02-03 DIAGNOSIS — E7800 Pure hypercholesterolemia, unspecified: Secondary | ICD-10-CM | POA: Insufficient documentation

## 2024-02-03 MED ORDER — ROSUVASTATIN CALCIUM 5 MG PO TABS
5.0000 mg | ORAL_TABLET | Freq: Every day | ORAL | 1 refills | Status: DC
Start: 2024-02-03 — End: 2024-03-15

## 2024-02-28 ENCOUNTER — Encounter: Payer: Self-pay | Admitting: Family Medicine

## 2024-03-09 ENCOUNTER — Encounter: Payer: Self-pay | Admitting: Family Medicine

## 2024-03-09 DIAGNOSIS — E7800 Pure hypercholesterolemia, unspecified: Secondary | ICD-10-CM

## 2024-03-15 MED ORDER — EZETIMIBE 10 MG PO TABS
10.0000 mg | ORAL_TABLET | Freq: Every day | ORAL | 1 refills | Status: DC
Start: 2024-03-15 — End: 2024-04-10

## 2024-03-20 ENCOUNTER — Other Ambulatory Visit: Payer: Self-pay | Admitting: Family Medicine

## 2024-03-20 DIAGNOSIS — Z1231 Encounter for screening mammogram for malignant neoplasm of breast: Secondary | ICD-10-CM

## 2024-03-30 ENCOUNTER — Encounter: Payer: Self-pay | Admitting: Family Medicine

## 2024-04-10 ENCOUNTER — Encounter: Payer: Self-pay | Admitting: Family Medicine

## 2024-04-10 ENCOUNTER — Ambulatory Visit: Admitting: Family Medicine

## 2024-04-10 VITALS — BP 115/74 | HR 71 | Ht 63.0 in | Wt 125.1 lb

## 2024-04-10 DIAGNOSIS — E7800 Pure hypercholesterolemia, unspecified: Secondary | ICD-10-CM

## 2024-04-10 DIAGNOSIS — Z8249 Family history of ischemic heart disease and other diseases of the circulatory system: Secondary | ICD-10-CM

## 2024-04-10 DIAGNOSIS — G43809 Other migraine, not intractable, without status migrainosus: Secondary | ICD-10-CM | POA: Diagnosis not present

## 2024-04-10 MED ORDER — COQ10 100 MG PO CAPS
100.0000 mg | ORAL_CAPSULE | Freq: Every day | ORAL | Status: AC
Start: 2024-04-10 — End: ?

## 2024-04-10 MED ORDER — ATORVASTATIN CALCIUM 10 MG PO TABS
10.0000 mg | ORAL_TABLET | Freq: Every day | ORAL | 2 refills | Status: DC
Start: 2024-04-10 — End: 2024-06-06

## 2024-04-10 MED ORDER — SUMATRIPTAN SUCCINATE 50 MG PO TABS
25.0000 mg | ORAL_TABLET | ORAL | 3 refills | Status: AC | PRN
Start: 2024-04-10 — End: ?

## 2024-04-10 NOTE — Progress Notes (Signed)
 Established patient visit   Patient: Crystal Higgins   DOB: 1961/01/18   63 y.o. Female  MRN: [id number] Visit Date: 04/10/2024  Today's healthcare provider: Jeralene Mom, MD   Chief Complaint  Patient presents with   Hyperlipidemia    Concerns with Ezetimibe     Subjective    Hyperlipidemia Pertinent negatives include no chest pain or shortness of breath.    Follow up hyperlipidemia since prescribed low dose rosuvastatin  in March. Had severe myalgias on rosuvastatin , nearly bed ridden. Changed to ezetimibe  about a month ago. Myalgias resolved but having persistent stomach aches which resolve if she stops ezetimibe . Has tried taking 1/2 tablet with same effect.   Also requests rf Imitrex  for migraines which remains effective and well tolerated. Tried several prophylactic medications which she does not tolerate, but only needs Imitrex  a few times a month.   Lab Results  Component Value Date   CHOL 216 (H) 01/31/2024   HDL 49 01/31/2024   LDLCALC 143 (H) 01/31/2024   TRIG 132 01/31/2024   CHOLHDL 4.4 01/31/2024     Medications: Outpatient Medications Prior to Visit  Medication Sig   amLODipine  (NORVASC ) 2.5 MG tablet Take 1 tablet (2.5 mg total) by mouth daily.   Butalbital -APAP-Caffeine  50-300-40 MG CAPS TAKE 1 TO 2 CAPSULES BY MOUTH EVERY 6 HOURS AS NEEDED FOR  HEADACHE   cholecalciferol (VITAMIN D) 1000 UNITS tablet Take by mouth.   CIPRODEX OTIC suspension 4 drops into left ear  twice daily   Coenzyme Q10 (COQ10) 100 MG CAPS Take by mouth.   docusate sodium (COLACE) 100 MG capsule Take 100 mg by mouth daily.   hydrocortisone  2.5 % cream Apply topically 2 (two) times daily.   Multiple Vitamin (MULTIVITAMIN WITH MINERALS) TABS tablet Take 1 tablet by mouth daily.   omeprazole  (PRILOSEC) 20 MG capsule TAKE 1 CAPSULE DAILY   pilocarpine (SALAGEN) 5 MG tablet Take 5 mg by mouth 3 (three) times daily.   pimecrolimus  (ELIDEL ) 1 % cream Apply to affected areas rash twice  daily as needed for rash.   polyethylene glycol powder (GLYCOLAX/MIRALAX) 17 GM/SCOOP powder Take 1 Container by mouth once.   vitamin B-12 (CYANOCOBALAMIN ) 500 MCG tablet Take 500 mcg by mouth every other day.   [DISCONTINUED] NURTEC 75 MG TBDP Take 1 tablet by mouth as needed.   [DISCONTINUED] SUMAtriptan  (IMITREX ) 50 MG tablet Take 25 mg by mouth as needed.   [DISCONTINUED] ezetimibe  (ZETIA ) 10 MG tablet Take 1 tablet (10 mg total) by mouth daily.   No facility-administered medications prior to visit.    Review of Systems  Constitutional:  Negative for appetite change, chills, fatigue and fever.  Respiratory:  Negative for chest tightness and shortness of breath.   Cardiovascular:  Negative for chest pain and palpitations.  Gastrointestinal:  Negative for abdominal pain, nausea and vomiting.  Neurological:  Negative for dizziness and weakness.       Objective    BP 115/74 (BP Location: Left Arm, Patient Position: Sitting, Cuff Size: Normal)   Pulse 71   Ht 5\' 3"  (1.6 m)   Wt 125 lb 1.6 oz (56.7 kg)   SpO2 97%   BMI 22.16 kg/m    Physical Exam   General appearance: Well developed, well nourished female, cooperative and in no acute distress Head: Normocephalic, without obvious abnormality, atraumatic Respiratory: Respirations even and unlabored, normal respiratory rate Extremities: All extremities are intact.  Skin: Skin color, texture, turgor normal. No rashes seen  Psych: Appropriate mood and affect. Neurologic: Mental status: Alert, oriented to person, place, and time, thought content appropriate.    Assessment & Plan     1. Pure hypercholesterolemia (Primary) Unable to take rosuvastatin  or ezetimibe   Try  - atorvastatin  (LIPITOR) 10 MG tablet; Take 1 tablet (10 mg total) by mouth daily.  Dispense: 30 tablet; Refill: 2 - Coenzyme Q10 (COQ10) 100 MG CAPS; Take 100 mg by mouth daily.  2. Family history of coronary artery disease LDL goal is <100  3. Other  migraine without status migrainosus, not intractable refill SUMAtriptan  (IMITREX ) 50 MG tablet; Take 0.5 tablets (25 mg total) by mouth as needed.  Dispense: 10 tablet; Refill: 3         Jeralene Mom, MD  [facility] [phone] (phone) [phone] (fax)  [facility]

## 2024-04-10 NOTE — Patient Instructions (Addendum)
 Please review the attached list of medications and notify my office if there are any errors.   Take a 100mg  capsul of CoEnzyme Q10 every day along with the atorvastatin    We will plan on rechecking your cholesterol at the end of June if you aren't having any problems with the atorvastatin 

## 2024-04-29 ENCOUNTER — Other Ambulatory Visit: Payer: Self-pay | Admitting: Family Medicine

## 2024-05-03 ENCOUNTER — Ambulatory Visit: Admitting: Family Medicine

## 2024-05-03 ENCOUNTER — Encounter: Payer: Self-pay | Admitting: Family Medicine

## 2024-05-03 DIAGNOSIS — H6690 Otitis media, unspecified, unspecified ear: Secondary | ICD-10-CM

## 2024-05-04 ENCOUNTER — Ambulatory Visit
Admission: RE | Admit: 2024-05-04 | Discharge: 2024-05-04 | Disposition: A | Discharge status: Home / Self Care | Source: Ambulatory Visit | Attending: Family Medicine | Admitting: Family Medicine

## 2024-05-04 DIAGNOSIS — Z1231 Encounter for screening mammogram for malignant neoplasm of breast: Secondary | ICD-10-CM | POA: Diagnosis not present

## 2024-05-04 MED ORDER — AZITHROMYCIN 250 MG PO TABS
ORAL_TABLET | ORAL | 0 refills | Status: AC
Start: 2024-05-04 — End: 2024-05-09

## 2024-05-22 ENCOUNTER — Encounter: Payer: Self-pay | Admitting: Family Medicine

## 2024-05-22 DIAGNOSIS — E7800 Pure hypercholesterolemia, unspecified: Secondary | ICD-10-CM

## 2024-06-06 MED ORDER — COLESEVELAM HCL 625 MG PO TABS
1875.0000 mg | ORAL_TABLET | Freq: Two times a day (BID) | ORAL | 2 refills | Status: DC
Start: 2024-06-06 — End: 2024-06-22

## 2024-06-21 ENCOUNTER — Encounter: Payer: Self-pay | Admitting: Family Medicine

## 2024-06-21 DIAGNOSIS — E7800 Pure hypercholesterolemia, unspecified: Secondary | ICD-10-CM

## 2024-06-22 MED ORDER — CHOLESTYRAMINE 4 G PO PACK: 4.0000 g | PACK | Freq: Two times a day (BID) | ORAL | 3 refills | Status: DC

## 2024-06-26 ENCOUNTER — Encounter: Payer: Self-pay | Admitting: Family Medicine

## 2024-06-26 DIAGNOSIS — E7800 Pure hypercholesterolemia, unspecified: Secondary | ICD-10-CM

## 2024-06-26 MED ORDER — EZETIMIBE 10 MG PO TABS
10.0000 mg | ORAL_TABLET | Freq: Every day | ORAL | 1 refills | Status: AC
Start: 2024-06-26 — End: ?

## 2024-07-03 ENCOUNTER — Telehealth (INDEPENDENT_AMBULATORY_CARE_PROVIDER_SITE_OTHER): Admitting: Family Medicine

## 2024-07-03 DIAGNOSIS — H6690 Otitis media, unspecified, unspecified ear: Secondary | ICD-10-CM | POA: Diagnosis not present

## 2024-07-03 MED ORDER — AZITHROMYCIN 250 MG PO TABS: ORAL_TABLET | ORAL | 0 refills | Status: DC

## 2024-07-03 NOTE — Progress Notes (Signed)
 MyChart Video Visit    Virtual Visit via Video Note   This format is felt to be most appropriate for this patient at this time. Physical exam was limited by quality of the video and audio technology used for the visit.   Patient location: home Provider location:  [facility]  I discussed the limitations of evaluation and management by telemedicine and the availability of in person appointments. The patient expressed understanding and agreed to proceed.  Patient: Crystal Higgins   DOB: Apr 02, 1961   63 y.o. Female  MRN: [id number] Visit Date: 07/03/2024  Today's healthcare provider: Nancyann Perry, MD    Subjective    HPI  Discussed the use of AI scribe software for clinical note transcription with the patient, who gave verbal consent to proceed.  History of Present Illness   Crystal Higgins is a 63 year old female who presents with ear pain and sore throat.  Her ear pain began early last week, initially improved, but worsened again on Saturday. She describes feeling 'pretty rough' since then. No fever, chills, or sweats are present.  She notes a sore throat and describes a burning sensation in her nose when breathing. She also mentions a blister-like appearance on the side of her throat.  She has a history of being treated with Zithromax  a couple of months ago, which typically helps her symptoms resolve.  She is currently taking ezetimibe  for cholesterol management. No cough, wheezing, or chest congestion.       Medications: Outpatient Medications Prior to Visit  Medication Sig   amLODipine  (NORVASC ) 2.5 MG tablet Take 1 tablet (2.5 mg total) by mouth daily.   Butalbital -APAP-Caffeine  50-300-40 MG CAPS TAKE 1 TO 2 CAPSULES BY MOUTH EVERY 6 HOURS AS NEEDED FOR  HEADACHE   cholecalciferol (VITAMIN D) 1000 UNITS tablet Take by mouth.   cholestyramine  (QUESTRAN ) 4 g packet Take 1 packet (4 g total) by mouth 2 (two) times daily.   CIPRODEX OTIC suspension 4 drops into left ear   twice daily   Coenzyme Q10 (COQ10) 100 MG CAPS Take 100 mg by mouth daily.   docusate sodium (COLACE) 100 MG capsule Take 100 mg by mouth daily.   ezetimibe  (ZETIA ) 10 MG tablet Take 1 tablet (10 mg total) by mouth daily.   hydrocortisone  2.5 % cream Apply topically 2 (two) times daily.   Multiple Vitamin (MULTIVITAMIN WITH MINERALS) TABS tablet Take 1 tablet by mouth daily.   omeprazole  (PRILOSEC) 20 MG capsule Take 1 capsule by mouth once daily   pilocarpine (SALAGEN) 5 MG tablet Take 5 mg by mouth 3 (three) times daily.   pimecrolimus  (ELIDEL ) 1 % cream Apply to affected areas rash twice daily as needed for rash.   polyethylene glycol powder (GLYCOLAX/MIRALAX) 17 GM/SCOOP powder Take 1 Container by mouth once.   SUMAtriptan  (IMITREX ) 50 MG tablet Take 0.5 tablets (25 mg total) by mouth as needed.   vitamin B-12 (CYANOCOBALAMIN ) 500 MCG tablet Take 500 mcg by mouth every other day.   No facility-administered medications prior to visit.   Review of Systems     Objective    There were no vitals taken for this visit.   Physical Exam  Awake, alert, oriented x 3. In no apparent distress   Assessment & Plan     1. Acute otitis media, unspecified otitis media type (Primary)  - azithromycin  (ZITHROMAX ) 250 MG tablet; Take 2 tablets on day 1, then 1 tablet daily on days 2 through 5  Dispense: 6 tablet; Refill: 0   Call if symptoms change or if not rapidly improving.         I discussed the assessment and treatment plan with the patient. The patient was provided an opportunity to ask questions and all were answered. The patient agreed with the plan and demonstrated an understanding of the instructions.   The patient was advised to call back or seek an in-person evaluation if the symptoms worsen or if the condition fails to improve as anticipated.  I provided 5 minutes of non-face-to-face time during this encounter.   Nancyann Perry, MD [facility] [phone] (phone) [phone] (fax)  [facility]

## 2024-07-12 ENCOUNTER — Encounter: Payer: Self-pay | Admitting: Family Medicine

## 2024-07-12 NOTE — Telephone Encounter (Signed)
Please see the message below and advise.

## 2024-07-27 ENCOUNTER — Other Ambulatory Visit: Payer: Self-pay | Admitting: Family Medicine

## 2024-07-27 DIAGNOSIS — G43809 Other migraine, not intractable, without status migrainosus: Secondary | ICD-10-CM

## 2024-08-22 ENCOUNTER — Ambulatory Visit: Admitting: Physician Assistant

## 2024-08-22 ENCOUNTER — Ambulatory Visit: Payer: Self-pay

## 2024-08-22 ENCOUNTER — Encounter: Payer: Self-pay | Admitting: Physician Assistant

## 2024-08-22 VITALS — BP 120/77 | HR 71 | Resp 16 | Ht 63.0 in | Wt 127.1 lb

## 2024-08-22 DIAGNOSIS — R1013 Epigastric pain: Secondary | ICD-10-CM

## 2024-08-22 DIAGNOSIS — R079 Chest pain, unspecified: Secondary | ICD-10-CM

## 2024-08-22 MED ORDER — FAMOTIDINE 20 MG PO TABS: 20.0000 mg | ORAL_TABLET | Freq: Two times a day (BID) | ORAL | 0 refills | Status: AC

## 2024-08-22 NOTE — Progress Notes (Signed)
 Established patient visit  Patient: Crystal Higgins   DOB: 12-09-1960   63 y.o. Female  MRN: [id number] Visit Date: 08/22/2024  Today's healthcare provider: Jolynn Spencer, PA-C   Chief Complaint  Patient presents with   Abdominal Pain    Ongoing 6 weeks pt complains of intermittent discomfort mid stomach, more painful when taking a deep breath. Pt possibly thought could be gallbladder.    Subjective     HPI     Abdominal Pain    Additional comments: Ongoing 6 weeks pt complains of intermittent discomfort mid stomach, more painful when taking a deep breath. Pt possibly thought could be gallbladder.       Last edited by Wilfred Hargis RAMAN, CMA on 08/22/2024  3:16 PM.       Discussed the use of AI scribe software for clinical note transcription with the patient, who gave verbal consent to proceed.  History of Present Illness Crystal Higgins is a 63 year old female with a history of heartburn and a positive autoimmune marker who presents with persistent abdominal pain.  She has experienced persistent abdominal pain for four to five weeks, beginning in the third week of August. The pain is located between the chest and the belly button, primarily on the left side, and is described as a constant, jabbing sensation. It worsens when lying on her left side and is not related to food intake, exercise, or specific movements.  She has been taking omeprazole  for several years for heartburn, which is currently well-controlled. She takes Excedrin, which contains aspirin, and recently stopped her cholesterol medication without any change in her symptoms.  She has a positive autoimmune marker with discussions of lupus but no confirmed diagnosis. She takes pilocarpine for dry mouth associated with her autoimmune condition.  There are no new medications, exercise routines, or recent illnesses. She denies dark stools, tingling, coughing, vomiting, or urinary symptoms such as burning with  urination. A colonoscopy two years ago was normal. She is concerned about her gallbladder, noting previous dull pain, but states the current pain is different.       04/10/2024    3:29 PM 01/31/2024   11:10 AM 03/25/2023    9:51 AM  Depression screen PHQ 2/9  Decreased Interest 0 0 0  Down, Depressed, Hopeless 0 0 0  PHQ - 2 Score 0 0 0  Altered sleeping   0  Tired, decreased energy   0  Change in appetite   0  Feeling bad or failure about yourself    0  Trouble concentrating   0  Moving slowly or fidgety/restless   0  Suicidal thoughts   0  PHQ-9 Score   0  Difficult doing work/chores   Not difficult at all      04/10/2024    3:29 PM 01/31/2024   11:10 AM  GAD 7 : Generalized Anxiety Score  Nervous, Anxious, on Edge 0 0  Control/stop worrying 0 0  Worry too much - different things 0 0  Trouble relaxing 0 0  Restless 0 0  Easily annoyed or irritable 0 0  Afraid - awful might happen 0 0  Total GAD 7 Score 0 0  Anxiety Difficulty Not difficult at all Not difficult at all    Medications: Outpatient Medications Prior to Visit  Medication Sig   amLODipine  (NORVASC ) 2.5 MG tablet Take 1 tablet (2.5 mg total) by mouth daily.   Butalbital -APAP-Caffeine  50-300-40 MG CAPS TAKE 1 TO 2 CAPSULES BY  MOUTH EVERY 6 HOURS AS NEEDED FOR HEADACHE(NO MORE THAN 6 TABS/DAY)   cholecalciferol (VITAMIN D) 1000 UNITS tablet Take by mouth.   cholestyramine  (QUESTRAN ) 4 g packet Take 1 packet (4 g total) by mouth 2 (two) times daily.   CIPRODEX OTIC suspension 4 drops into left ear  twice daily   Coenzyme Q10 (COQ10) 100 MG CAPS Take 100 mg by mouth daily.   docusate sodium (COLACE) 100 MG capsule Take 100 mg by mouth daily.   ezetimibe  (ZETIA ) 10 MG tablet Take 1 tablet (10 mg total) by mouth daily.   hydrocortisone  2.5 % cream Apply topically 2 (two) times daily.   Multiple Vitamin (MULTIVITAMIN WITH MINERALS) TABS tablet Take 1 tablet by mouth daily.   omeprazole  (PRILOSEC) 20 MG capsule Take 1  capsule by mouth once daily   pilocarpine (SALAGEN) 5 MG tablet Take 5 mg by mouth 3 (three) times daily.   pimecrolimus  (ELIDEL ) 1 % cream Apply to affected areas rash twice daily as needed for rash.   polyethylene glycol powder (GLYCOLAX/MIRALAX) 17 GM/SCOOP powder Take 1 Container by mouth once.   SUMAtriptan  (IMITREX ) 50 MG tablet Take 0.5 tablets (25 mg total) by mouth as needed.   vitamin B-12 (CYANOCOBALAMIN ) 500 MCG tablet Take 500 mcg by mouth every other day.   No facility-administered medications prior to visit.    Review of Systems All negative Except see HPI   {Insert previous labs (optional):23779} {See past labs  Heme  Chem  Endocrine  Serology  Results Review (optional):1}   Objective    BP 120/77   Pulse 71   Resp 16   Ht 5' 3 (1.6 m)   Wt 127 lb 1.6 oz (57.7 kg)   SpO2 100%   BMI 22.51 kg/m  {Insert last BP/Wt (optional):23777}{See vitals history (optional):1}   Physical Exam Vitals reviewed.  Constitutional:      General: She is not in acute distress.    Appearance: Normal appearance. She is well-developed. She is not diaphoretic.  HENT:     Head: Normocephalic and atraumatic.  Eyes:     General: No scleral icterus.    Conjunctiva/sclera: Conjunctivae normal.  Neck:     Thyroid : No thyromegaly.  Cardiovascular:     Rate and Rhythm: Normal rate and regular rhythm.     Pulses: Normal pulses.     Heart sounds: Normal heart sounds. No murmur heard. Pulmonary:     Effort: Pulmonary effort is normal. No respiratory distress.     Breath sounds: Normal breath sounds. No wheezing, rhonchi or rales.  Musculoskeletal:     Cervical back: Neck supple.     Right lower leg: No edema.     Left lower leg: No edema.  Lymphadenopathy:     Cervical: No cervical adenopathy.  Skin:    General: Skin is warm and dry.     Findings: No rash.  Neurological:     Mental Status: She is alert and oriented to person, place, and time. Mental status is at baseline.   Psychiatric:        Mood and Affect: Mood normal.        Behavior: Behavior normal.      No results found for any visits on 08/22/24.      Assessment & Plan Epigastric pain Chronic epigastric pain for 4-5 weeks, constant, worsens when lying on left side. Unlikely gallbladder pain. - Order EKG to rule out cardiac causes. - Switch from omeprazole  to famotidine  for 2 weeks. -  Order lab work: liver function tests, pancreatic enzymes, C-reactive protein, urinalysis. - Advise dietary modifications: avoid spicy, salty foods, consume bland diet. - Consider gastroenterology referral if symptoms persist or labs inconclusive. - Discuss potential abdominal ultrasound if pain persists.    Orders Placed This Encounter  Procedures   Lipase   C-reactive protein   CBC with Differential/Platelet   Comprehensive metabolic panel with GFR   Urinalysis, Routine w reflex microscopic   Amylase   EKG 12-Lead    Return in about 2 weeks (around 09/05/2024).   The patient was advised to call back or seek an in-person evaluation if the symptoms worsen or if the condition fails to improve as anticipated.  I discussed the assessment and treatment plan with the patient. The patient was provided an opportunity to ask questions and all were answered. The patient agreed with the plan and demonstrated an understanding of the instructions.  I, Ahilyn Nell, PA-C have reviewed all documentation for this visit. The documentation on 08/22/2024  for the exam, diagnosis, procedures, and orders are all accurate and complete.  Jolynn Spencer, [facility], MMS [facility] [phone] (phone) [phone] (fax)  [facility]

## 2024-08-22 NOTE — Telephone Encounter (Signed)
 FYI Only or Action Required?: Action required by provider: update on patient condition.- Pt refused ED- appt made for today with PA in office. Patient understands ED is best.   Patient was last seen in primary care on 07/03/2024 by Gasper Nancyann BRAVO, MD.  Called Nurse Triage reporting Chest Pain.  Symptoms began about a month ago.  Interventions attempted: Rest, hydration, or home remedies.  Symptoms are: gradually worsening.  Triage Disposition: Go to ED Now (or PCP Triage)  Patient/caregiver understands and will follow disposition?: No, wishes to speak with PCP Copied from CRM #[id number]. Topic: Clinical - Red Word Triage >> Aug 22, 2024  1:39 PM Crystal Higgins wrote: Red Word that prompted transfer to Nurse Triage: Pain in upper left side behind rib (over a month). Hard time to sleep. Reason for Disposition  Taking a deep breath makes pain worse  Answer Assessment - Initial Assessment Questions Since mid August, patient has had intermittent chest discomfort that is more constant now. She states it is a frustrating annoying pain 4/10 and it hurts to sleep on that side. She has hx of Lupus, gallbladder issues, and recently started cholesterol meds in August. She trialed off the cholesterol meds to see if it helped with no effect. Has since restarted.    1. LOCATION: Where does it hurt?       Under the ribs on her Left side low and beside the breast bone  2. RADIATION: Does the pain go anywhere else? (e.g., into neck, jaw, arms, back)     denies 3. ONSET: When did the chest pain begin? (Minutes, hours or days)      Middle of August 4. PATTERN: Does the pain come and go, or has it been constant since it started?  Does it get worse with exertion?      Was coming and going but now it is constant- worse sleeping on that side 5. DURATION: How long does it last (e.g., seconds, minutes, hours)     Constant frustrating/annoying pain 6. SEVERITY: How bad is the pain?  (e.g., Scale  1-10; mild, moderate, or severe)     4/10 right now, hurts worse when she sleeps on her side- feels sharper under her breastbone  7. CARDIAC RISK FACTORS: Do you have any history of heart problems or risk factors for heart disease? (e.g., angina, prior heart attack; diabetes, high blood pressure, high cholesterol, smoker, or strong family history of heart disease)     HTN  8. PULMONARY RISK FACTORS: Do you have any history of lung disease?  (e.g., blood clots in lung, asthma, emphysema, birth control pills)     Denies SOB but hurts when she breathes deep 9. CAUSE: What do you think is causing the chest pain?     Possible gall bladder issues or lupus complications 10. OTHER SYMPTOMS: Do you have any other symptoms? (e.g., dizziness, nausea, vomiting, sweating, fever, difficulty breathing, cough)       denies  Protocols used: Chest Pain-A-AH

## 2024-08-23 LAB — COMPREHENSIVE METABOLIC PANEL WITH GFR
ALT: 19 IU/L (ref 0–32)
AST: 22 IU/L (ref 0–40)
Albumin: 4.5 g/dL (ref 3.9–4.9)
Alkaline Phosphatase: 81 IU/L (ref 49–135)
BUN/Creatinine Ratio: 15 (ref 12–28)
BUN: 10 mg/dL (ref 8–27)
Bilirubin Total: 0.2 mg/dL (ref 0.0–1.2)
CO2: 23 mmol/L (ref 20–29)
Calcium: 9.8 mg/dL (ref 8.7–10.3)
Chloride: 104 mmol/L (ref 96–106)
Creatinine, Ser: 0.68 mg/dL (ref 0.57–1.00)
Globulin, Total: 2.4 g/dL (ref 1.5–4.5)
Glucose: 82 mg/dL (ref 70–99)
Potassium: 4.2 mmol/L (ref 3.5–5.2)
Sodium: 142 mmol/L (ref 134–144)
Total Protein: 6.9 g/dL (ref 6.0–8.5)
eGFR: 98 mL/min/1.73 (ref >59)

## 2024-08-23 LAB — URINALYSIS, ROUTINE W REFLEX MICROSCOPIC
Bilirubin, UA: NEGATIVE
Glucose, UA: NEGATIVE
Ketones, UA: NEGATIVE
Leukocytes,UA: NEGATIVE
Nitrite, UA: NEGATIVE
Protein,UA: NEGATIVE
RBC, UA: NEGATIVE
Specific Gravity, UA: 1.009 (ref 1.005–1.030)
Urobilinogen, Ur: 0.2 mg/dL (ref 0.2–1.0)
pH, UA: 6 (ref 5.0–7.5)

## 2024-08-23 LAB — CBC WITH DIFFERENTIAL/PLATELET
Basophils Absolute: 0.1 x10E3/uL (ref 0.0–0.2)
Basos: 1 %
EOS (ABSOLUTE): 0.3 x10E3/uL (ref 0.0–0.4)
Eos: 3 %
Hematocrit: 43.7 % (ref 34.0–46.6)
Hemoglobin: 14.3 g/dL (ref 11.1–15.9)
Immature Grans (Abs): 0 x10E3/uL (ref 0.0–0.1)
Immature Granulocytes: 0 %
Lymphocytes Absolute: 2.5 x10E3/uL (ref 0.7–3.1)
Lymphs: 27 %
MCH: 28.8 pg (ref 26.6–33.0)
MCHC: 32.7 g/dL (ref 31.5–35.7)
MCV: 88 fL (ref 79–97)
Monocytes Absolute: 1 x10E3/uL — ABNORMAL HIGH (ref 0.1–0.9)
Monocytes: 11 %
Neutrophils Absolute: 5.3 x10E3/uL (ref 1.4–7.0)
Neutrophils: 58 %
Platelets: 372 x10E3/uL (ref 150–450)
RBC: 4.96 x10E6/uL (ref 3.77–5.28)
RDW: 12.7 % (ref 11.7–15.4)
WBC: 9.2 x10E3/uL (ref 3.4–10.8)

## 2024-08-23 LAB — LIPASE: Lipase: 48 U/L (ref 14–72)

## 2024-08-23 LAB — AMYLASE: Amylase: 76 U/L (ref 31–110)

## 2024-08-23 LAB — C-REACTIVE PROTEIN: CRP: 4 mg/L (ref 0–10)

## 2024-08-24 ENCOUNTER — Encounter: Payer: Self-pay | Admitting: Physician Assistant

## 2024-08-24 ENCOUNTER — Ambulatory Visit: Payer: Self-pay | Admitting: Physician Assistant

## 2024-09-05 ENCOUNTER — Ambulatory Visit: Admitting: Physician Assistant

## 2024-09-11 ENCOUNTER — Encounter: Payer: Self-pay | Admitting: Family Medicine

## 2024-09-11 ENCOUNTER — Ambulatory Visit: Admitting: Family Medicine

## 2024-09-11 VITALS — BP 131/71 | HR 77 | Resp 16 | Ht 63.0 in | Wt 126.0 lb

## 2024-09-11 DIAGNOSIS — K824 Cholesterolosis of gallbladder: Secondary | ICD-10-CM

## 2024-09-11 DIAGNOSIS — R1012 Left upper quadrant pain: Secondary | ICD-10-CM | POA: Diagnosis not present

## 2024-09-11 NOTE — Progress Notes (Addendum)
 Established patient visit   Patient: Crystal Higgins   DOB: 10-21-1961   63 y.o. Female  MRN: [id number] Visit Date: 09/11/2024  Today's healthcare provider: Nancyann Perry, MD   Chief Complaint  Patient presents with   Acute Visit    Side pain( Lt upper ribs) Since Aug    Subjective    Discussed the use of AI scribe software for clinical note transcription with the patient, who gave verbal consent to proceed.  History of Present Illness   Crystal Higgins is a 63 year old female who presents with persistent left-sided rib pain.  She has been experiencing pain located behind her left rib since August. Initially, the pain was constant but now it is intermittent. It is described as an ache rather than a burning or stinging sensation. The pain does not radiate to her back and is not affected by eating, coughing, or sneezing. Initially, it was exacerbated by deep breaths and coughing.  The pain often wakes her up at night, especially since she is a side sleeper, and it is more pronounced at night than during the day. She has not found relief with Tylenol , heating pads, or ice packs.  She was previously evaluated by another physician who suspected a stomach issue and changed her medication from omeprazole  to Pepcid , but this did not alleviate her symptoms. She has a history of gallstones and a gallbladder polyp noted on an ultrasound a year and a half ago, but she does not believe these are related to her current pain as they are on the opposite side.  She has been on methotrexate for a long time and has tried to reduce her dose, but her symptoms return if she does. Her daughter suspects an autoimmune component, possibly lupus, as she has been referred to as having lupus by a rheumatologist, although it has not been definitively diagnosed.       Medications: Outpatient Medications Prior to Visit  Medication Sig   amLODipine  (NORVASC ) 2.5 MG tablet Take 1 tablet (2.5 mg total)  by mouth daily.   Butalbital -APAP-Caffeine  50-300-40 MG CAPS TAKE 1 TO 2 CAPSULES BY MOUTH EVERY 6 HOURS AS NEEDED FOR HEADACHE(NO MORE THAN 6 TABS/DAY)   cholecalciferol (VITAMIN D) 1000 UNITS tablet Take by mouth.   cholestyramine  (QUESTRAN ) 4 g packet Take 1 packet (4 g total) by mouth 2 (two) times daily.   CIPRODEX OTIC suspension 4 drops into left ear  twice daily   Coenzyme Q10 (COQ10) 100 MG CAPS Take 100 mg by mouth daily.   docusate sodium (COLACE) 100 MG capsule Take 100 mg by mouth daily.   ezetimibe  (ZETIA ) 10 MG tablet Take 1 tablet (10 mg total) by mouth daily.   famotidine  (PEPCID ) 20 MG tablet Take 1 tablet (20 mg total) by mouth 2 (two) times daily.   hydrocortisone  2.5 % cream Apply topically 2 (two) times daily.   Multiple Vitamin (MULTIVITAMIN WITH MINERALS) TABS tablet Take 1 tablet by mouth daily.   omeprazole  (PRILOSEC) 20 MG capsule Take 1 capsule by mouth once daily   pilocarpine (SALAGEN) 5 MG tablet Take 5 mg by mouth 3 (three) times daily.   pimecrolimus  (ELIDEL ) 1 % cream Apply to affected areas rash twice daily as needed for rash.   polyethylene glycol powder (GLYCOLAX/MIRALAX) 17 GM/SCOOP powder Take 1 Container by mouth once.   SUMAtriptan  (IMITREX ) 50 MG tablet Take 0.5 tablets (25 mg total) by mouth as needed.   vitamin B-12 (  CYANOCOBALAMIN ) 500 MCG tablet Take 500 mcg by mouth every other day.   No facility-administered medications prior to visit.   Review of Systems  Constitutional:  Negative for appetite change, chills, fatigue and fever.  Respiratory:  Negative for chest tightness and shortness of breath.   Cardiovascular:  Negative for chest pain and palpitations.  Gastrointestinal:  Negative for abdominal pain, nausea and vomiting.  Neurological:  Negative for dizziness and weakness.       Objective    BP 131/71 (BP Location: Left Arm, Patient Position: Sitting, Cuff Size: Normal)   Pulse 77   Resp 16   Ht 5' 3 (1.6 m)   Wt 126 lb (57.2 kg)    SpO2 100%   BMI 22.32 kg/m   Physical Exam   General Appearance:    Well developed, well nourished female, alert, cooperative, in no acute distress  Eyes:    PERRL, conjunctiva/corneas clear, EOM's intact       Lungs:     Clear to auscultation bilaterally, respirations unlabored  Heart:    Normal heart rate. Normal rhythm. No murmurs, rubs, or gallops.    Abdomen:   bowel sounds present and normal in all 4 quadrants, soft, round, nontender, or nondistended. No CVA tenderness      Assessment & Plan    1. LUQ pain (Primary)  - US  Abdomen Complete; Future  2. Gallbladder polyp Due for follow up with US  Abdomen Complete; Future      Nancyann Perry, MD  [facility] [phone] (phone) [phone] (fax)  [facility]

## 2024-09-14 ENCOUNTER — Ambulatory Visit
Admission: RE | Admit: 2024-09-14 | Discharge: 2024-09-14 | Disposition: A | Discharge status: Home / Self Care | Source: Ambulatory Visit | Attending: Family Medicine | Admitting: Family Medicine

## 2024-09-14 DIAGNOSIS — K824 Cholesterolosis of gallbladder: Secondary | ICD-10-CM | POA: Diagnosis not present

## 2024-09-14 DIAGNOSIS — R1012 Left upper quadrant pain: Secondary | ICD-10-CM | POA: Diagnosis not present

## 2024-09-14 DIAGNOSIS — N2889 Other specified disorders of kidney and ureter: Secondary | ICD-10-CM | POA: Diagnosis not present

## 2024-09-17 ENCOUNTER — Ambulatory Visit: Payer: Self-pay | Admitting: Family Medicine

## 2024-09-17 DIAGNOSIS — N2889 Other specified disorders of kidney and ureter: Secondary | ICD-10-CM

## 2024-09-18 ENCOUNTER — Ambulatory Visit

## 2024-09-20 ENCOUNTER — Telehealth: Payer: Self-pay

## 2024-09-20 ENCOUNTER — Encounter: Payer: Self-pay | Admitting: Family Medicine

## 2024-09-20 DIAGNOSIS — R1012 Left upper quadrant pain: Secondary | ICD-10-CM

## 2024-09-20 NOTE — Telephone Encounter (Signed)
 Noted. We will call once report has been signed off.

## 2024-09-20 NOTE — Telephone Encounter (Signed)
 Copied from CRM [id number]. Topic: Clinical - Lab/Test Results >> Sep 20, 2024  9:26 AM Antwanette L wrote: Reason for CRM: The patient is calling to request the ultrasound results from 10/23. They would like a callback at [phone] once the results are available.

## 2024-09-25 ENCOUNTER — Ambulatory Visit
Admission: RE | Admit: 2024-09-25 | Discharge: 2024-09-25 | Disposition: A | Discharge status: Home / Self Care | Source: Ambulatory Visit | Attending: Family Medicine | Admitting: Family Medicine

## 2024-09-25 DIAGNOSIS — K7689 Other specified diseases of liver: Secondary | ICD-10-CM | POA: Diagnosis not present

## 2024-09-25 DIAGNOSIS — N2889 Other specified disorders of kidney and ureter: Secondary | ICD-10-CM | POA: Insufficient documentation

## 2024-09-25 DIAGNOSIS — N281 Cyst of kidney, acquired: Secondary | ICD-10-CM | POA: Diagnosis not present

## 2024-09-25 MED ORDER — GADOBUTROL 1 MMOL/ML IV SOLN
5.0000 mL | Freq: Once | INTRAVENOUS | Status: AC | PRN
  Administered 2024-09-25: 5 mL via INTRAVENOUS

## 2024-09-27 ENCOUNTER — Ambulatory Visit: Payer: Self-pay

## 2024-09-27 ENCOUNTER — Ambulatory Visit: Payer: Self-pay | Admitting: Family Medicine

## 2024-09-27 DIAGNOSIS — G96191 Perineural cyst: Secondary | ICD-10-CM | POA: Insufficient documentation

## 2024-09-27 DIAGNOSIS — R1010 Upper abdominal pain, unspecified: Secondary | ICD-10-CM

## 2024-09-27 NOTE — Addendum Note (Signed)
 Addended by: GASPER GOLAS E on: 09/27/2024 12:20 PM   Modules accepted: Orders

## 2024-09-27 NOTE — Telephone Encounter (Signed)
 Copied from CRM [id number]. Topic: Clinical - Lab/Test Results >> Sep 27, 2024  9:46 AM Shanda MATSU wrote: Reason for CRM: Patient stated that results of MRI she had done are back, wants to discuss results with provider, is req a call back

## 2024-09-27 NOTE — Telephone Encounter (Signed)
 Copied from CRM [id number]. Topic: Referral - Request for Referral >> Sep 27, 2024  9:49 AM Shanda MATSU wrote: Did the patient discuss referral with their provider in the last year? Yes (If No - schedule appointment) (If Yes - send message)  Appointment offered? No  Type of order/referral and detailed reason for visit: Gastroenterologist  Preference of office, provider, location: unknown  If referral order, have you been seen by this specialty before? Yes (If Yes, this issue or another issue? When? Where?  Can we respond through MyChart? Yes

## 2024-09-27 NOTE — Telephone Encounter (Addendum)
 FYI Only or Action Required?: Action required by provider: referral request.  Patient was last seen in primary care on 09/11/2024 by Crystal Nancyann BRAVO, MD.  Called Nurse Triage reporting Abdominal Pain.  Symptoms began several months ago.  Symptoms are: unchanged.  Triage Disposition: See PCP Within 2 Weeks  Patient/caregiver understands and will follow disposition?: No, wishes to speak with PCP      Copied from CRM [id number]. Topic: Clinical - Red Word Triage >> Sep 27, 2024  9:50 AM Shanda MATSU wrote: Red Word that prompted transfer to Nurse Triage: Patient is reporting intermittent pain on left side behind rib.       Reason for Disposition  Abdominal pain is a chronic symptom (recurrent or ongoing AND present > 4 weeks)  Answer Assessment - Initial Assessment Questions Patient declined an appointment stating she has been evaluated by Dr. Gasper for the same symptoms, and is requesting a referral to a gastroenterologist. Please advise.       1. LOCATION: Where does it hurt?      Left sided pain 2. RADIATION: Does the pain shoot anywhere else? (e.g., chest, back)     No radiation  3. ONSET: When did the pain begin? (e.g., minutes, hours or days ago)      2-3 months ago  4. SUDDEN: Gradual or sudden onset?     Sudden  5. PATTERN Does the pain come and go, or is it constant?     Intermittent  6. SEVERITY: How bad is the pain?  (e.g., Scale 1-10; mild, moderate, or severe)     3/10 7. RECURRENT SYMPTOM: Have you ever had this type of stomach pain before? If Yes, ask: When was the last time? and What happened that time?      No 8. CAUSE: What do you think is causing the stomach pain? (e.g., gallstones, recent abdominal surgery)     Unsure  9. RELIEVING/AGGRAVATING FACTORS: What makes it better or worse? (e.g., antacids, bending or twisting motion, bowel movement)     Omeprazole , patient states she took an extra dose today and her pain improved  10.  OTHER SYMPTOMS: Do you have any other symptoms? (e.g., back pain, diarrhea, fever, urination pain, vomiting)       No  Protocols used: Abdominal Pain - Female-A-AH

## 2024-10-04 ENCOUNTER — Telehealth: Admitting: Family Medicine

## 2024-10-04 DIAGNOSIS — H6690 Otitis media, unspecified, unspecified ear: Secondary | ICD-10-CM | POA: Diagnosis not present

## 2024-10-04 MED ORDER — AZITHROMYCIN 250 MG PO TABS: ORAL_TABLET | ORAL | 0 refills | Status: AC

## 2024-10-04 NOTE — Progress Notes (Signed)
 MyChart Video Visit    Virtual Visit via Video Note   This format is felt to be most appropriate for this patient at this time. Physical exam was limited by quality of the video and audio technology used for the visit.   Patient location: home Provider location: [facility]  I discussed the limitations of evaluation and management by telemedicine and the availability of in person appointments. The patient expressed understanding and agreed to proceed.  Patient: Crystal Higgins   DOB: 26-Aug-1961   63 y.o. Female  MRN: [id number] Visit Date: 10/04/2024  Today's healthcare provider: Nancyann Perry, MD    Subjective    HPI  C/o left ear pain identical to multiple previous episodes successfully treated with azithormycin. Has had thorough ENT evaluation with no explanation for recurrent infections. No other URI symptoms. No fevers, chills or sweats.   Medications: Outpatient Medications Prior to Visit  Medication Sig   amLODipine  (NORVASC ) 2.5 MG tablet Take 1 tablet (2.5 mg total) by mouth daily.   Butalbital -APAP-Caffeine  50-300-40 MG CAPS TAKE 1 TO 2 CAPSULES BY MOUTH EVERY 6 HOURS AS NEEDED FOR HEADACHE(NO MORE THAN 6 TABS/DAY)   cholecalciferol (VITAMIN D) 1000 UNITS tablet Take by mouth.   cholestyramine  (QUESTRAN ) 4 g packet Take 1 packet (4 g total) by mouth 2 (two) times daily.   CIPRODEX OTIC suspension 4 drops into left ear  twice daily   Coenzyme Q10 (COQ10) 100 MG CAPS Take 100 mg by mouth daily.   docusate sodium (COLACE) 100 MG capsule Take 100 mg by mouth daily.   ezetimibe  (ZETIA ) 10 MG tablet Take 1 tablet (10 mg total) by mouth daily.   famotidine  (PEPCID ) 20 MG tablet Take 1 tablet (20 mg total) by mouth 2 (two) times daily.   hydrocortisone  2.5 % cream Apply topically 2 (two) times daily.   Multiple Vitamin (MULTIVITAMIN WITH MINERALS) TABS tablet Take 1 tablet by mouth daily.   omeprazole  (PRILOSEC) 20 MG capsule Take 1 capsule by mouth once daily   pilocarpine  (SALAGEN) 5 MG tablet Take 5 mg by mouth 3 (three) times daily.   pimecrolimus  (ELIDEL ) 1 % cream Apply to affected areas rash twice daily as needed for rash.   polyethylene glycol powder (GLYCOLAX/MIRALAX) 17 GM/SCOOP powder Take 1 Container by mouth once.   SUMAtriptan  (IMITREX ) 50 MG tablet Take 0.5 tablets (25 mg total) by mouth as needed.   vitamin B-12 (CYANOCOBALAMIN ) 500 MCG tablet Take 500 mcg by mouth every other day.   No facility-administered medications prior to visit.   Review of Systems  Constitutional:  Negative for appetite change, chills, fatigue and fever.  Respiratory:  Negative for chest tightness and shortness of breath.   Cardiovascular:  Negative for chest pain and palpitations.  Gastrointestinal:  Negative for abdominal pain, nausea and vomiting.  Neurological:  Negative for dizziness and weakness.       Objective    There were no vitals taken for this visit.   Physical Exam  Awake, alert, oriented x 3. In no apparent distress.    Assessment & Plan     1. Acute otitis media, unspecified otitis media type  - azithromycin  (ZITHROMAX ) 250 MG tablet; Take 2 tablets on day 1, then 1 tablet daily on days 2 through 5  Dispense: 6 tablet; Refill: 0       I discussed the assessment and treatment plan with the patient. The patient was provided an opportunity to ask questions and all were answered. The  patient agreed with the plan and demonstrated an understanding of the instructions.   The patient was advised to call back or seek an in-person evaluation if the symptoms worsen or if the condition fails to improve as anticipated.  I provided 9 minutes of non-face-to-face time during this encounter.   Nancyann Perry, MD [facility] [phone] (phone) [phone] (fax)  [facility]

## 2024-11-08 ENCOUNTER — Ambulatory Visit
Admission: RE | Admit: 2024-11-08 | Discharge: 2024-11-08 | Disposition: A | Discharge status: Home / Self Care | Source: Ambulatory Visit | Attending: Family Medicine | Admitting: Family Medicine

## 2024-11-08 ENCOUNTER — Ambulatory Visit
Admission: RE | Admit: 2024-11-08 | Discharge: 2024-11-08 | Disposition: A | Discharge status: Home / Self Care | Attending: Family Medicine | Admitting: Family Medicine

## 2024-11-08 DIAGNOSIS — R1012 Left upper quadrant pain: Secondary | ICD-10-CM | POA: Diagnosis not present

## 2024-11-08 DIAGNOSIS — R0781 Pleurodynia: Secondary | ICD-10-CM | POA: Diagnosis not present

## 2024-12-04 ENCOUNTER — Ambulatory Visit: Payer: Self-pay

## 2024-12-04 ENCOUNTER — Encounter: Payer: Self-pay | Admitting: Family Medicine

## 2024-12-04 ENCOUNTER — Telehealth: Admitting: Physician Assistant

## 2024-12-04 DIAGNOSIS — H65195 Other acute nonsuppurative otitis media, recurrent, left ear: Secondary | ICD-10-CM

## 2024-12-04 DIAGNOSIS — H6992 Unspecified Eustachian tube disorder, left ear: Secondary | ICD-10-CM | POA: Diagnosis not present

## 2024-12-04 DIAGNOSIS — J309 Allergic rhinitis, unspecified: Secondary | ICD-10-CM | POA: Diagnosis not present

## 2024-12-04 DIAGNOSIS — H6690 Otitis media, unspecified, unspecified ear: Secondary | ICD-10-CM

## 2024-12-04 NOTE — Telephone Encounter (Signed)
 FYI Only or Action Required?: FYI only for provider: appointment scheduled on virtual today.  Patient was last seen in primary care on 10/04/2024 by Crystal Nancyann BRAVO, MD.  Called Nurse Triage reporting Ear Pain.  Symptoms began a week ago.  Interventions attempted: OTC medications: Tylenol .  Symptoms are: gradually worsening.  Triage Disposition: See HCP Within 4 Hours (Or PCP Triage)  Patient/caregiver understands and will follow disposition?: Yes   Reason for Disposition  [1] SEVERE pain (e.g., excruciating) and [2] not improved 2 hours after pain medicine (e.g., acetaminophen  or ibuprofen)  Answer Assessment - Initial Assessment Questions Additional info: Patient calling in requesting a virtual visit for one week of left sided ear pain and pressure with decreased hearing, that is causing intermittent dizziness when pressure is intense.  She reports she has a lot of ear issues and scarring. No drainage at this time but does feel like it is about to burst.  PCP next available appointment is not until 12/11/24. Scheduled acute virtual visit today with another provider.   1. LOCATION: Which ear is involved?     left 2. ONSET: When did the ear pain start?      One week ago  3. SEVERITY: How bad is the pain?  (Scale 1-10; mild, moderate or severe)     Tylenol  with some relief. Moderate-severe  4. URI SYMPTOMS: Do you have a runny nose or cough?     Denies  5. FEVER: Do you have a fever? If Yes, ask: What is your temperature, how was it measured, and when did it start?     Denies  6. CAUSE: Have you been swimming recently?, How often do you use Q-TIPS?, Have you had any recent air travel or scuba diving?      7. OTHER SYMPTOMS: Do you have any other symptoms? (e.g., decreased hearing, dizziness, headache, stiff neck, vomiting)     Ear pressure, dizziness when ear pressure is intense.  8. PREGNANCY: Is there any chance you are pregnant? When was your last  menstrual period?  Protocols used: Earache-A-AH  Copied from CRM #[id number]. Topic: Clinical - Red Word Triage >> Dec 04, 2024 12:23 PM Victoria B wrote: [facility] that prompted transfer to Nurse Triage: Patient called in pain in left ear and feels full says getting worse since yesterday

## 2024-12-05 ENCOUNTER — Telehealth: Payer: Self-pay

## 2024-12-05 MED ORDER — AZITHROMYCIN 250 MG PO TABS: ORAL_TABLET | ORAL | 0 refills | Status: AC

## 2024-12-05 MED ORDER — CIPRODEX 0.3-0.1 % OT SUSP: 4.0000 [drp] | Freq: Two times a day (BID) | OTIC | 0 refills | Status: AC

## 2024-12-05 NOTE — Telephone Encounter (Signed)
 Copied from CRM [id number]. Topic: Clinical - Medication Question >> Dec 05, 2024  9:48 AM Crystal Higgins wrote: Reason for CRM: Patient called in regarding her virtual appointment yesterday with PA Jolynn Spencer, patient stated she was suppose to get prescribed something yesterday would like a callback about the status of that

## 2024-12-05 NOTE — Progress Notes (Signed)
 " MyChart Video Visit  Virtual Visit via Video Note   This format is felt to be most appropriate for this patient at this time. Physical exam was limited by quality of the video and audio technology used for the visit.   Provider location: Virtual Visit Location Provider: Office/Clinic Patient Location: Home  I discussed the limitations of evaluation and management by telemedicine and the availability of in person appointments. The patient expressed understanding and agreed to proceed.  Patient: Crystal Higgins   DOB: 14-Mar-1961   64 y.o. Female  MRN: [id number] Visit Date: 12/04/2024  Today's healthcare provider: Jolynn Spencer, PA-C   No chief complaint on file.  Subjective     Discussed the use of AI scribe software for clinical note transcription with the patient, who gave verbal consent to proceed.  History of Present Illness Crystal Higgins is a 64 year old female who presents with ear pain and congestion.  She describes recurrent left-sided ear pain and congestion from suspected Eustachian tube dysfunction for several years, with symptoms improving with antibiotics. She prefers medical over surgical management and currently uses antihistamines, nasal saline irrigation, and intranasal steroids like Flonase with partial relief. She typically needs oral antibiotics about every three months for flares, which she finds helpful.  She uses Ciprodex  ear drops for infections despite an allergy to levofloxacin  in the same class, and she has had no adverse reactions to Ciprodex . She denies headaches or significant persistent dizziness, noting only brief, mild dizziness at times.     Medications: Show/hide medication list[1]  Review of Systems All negative  Except see HPI       Objective    There were no vitals taken for this visit.       Physical Exam  Constitutional:      General: She is not in acute distress.    Appearance: Normal appearance.  HENT:     Head:  Normocephalic.  Pulmonary:     Effort: Pulmonary effort is normal. No respiratory distress.  Neurological:     Mental Status: She is alert and oriented to person, place, and time. Mental status is at baseline.       Assessment & Plan Recurrent otitis media with effusion/Eustachian tube dysfunction Recurrent left-sided episodes, multifactorial, no infection. Symptoms respond to antibiotics, raising resistance concerns. Previous management included antihistamines, nasal saline, and nasal steroids. - Prescribed Ciprodex  ear drops temporarily. Per pt only antibiotics could help with worsening of her symptoms. Discussed the high risk for side effects/see allergies&contraindications section. Pt agreed and expressed her understanding. - Consult with Dr. Gasper on antibiotic management. Of note, pt cannot tolerate penicillin, doxycycline . - Continue nasal spray and Flonase to manage symptoms and delay antibiotics. Was evaluated by ENT. Declined surgical treatment. RTC if symptoms worsen  Allergic rhinitis Managed with antihistamines, nasal saline rinse and nasal steroids. - Continue antihistamines and nasal steroid spray. Will follow-up with pcp if symptoms worsen  No follow-ups on file.     I discussed the assessment and treatment plan with the patient. The patient was provided an opportunity to ask questions and all were answered. The patient agreed with the plan and demonstrated an understanding of the instructions.   The patient was advised to call back or seek an in-person evaluation if the symptoms worsen or if the condition fails to improve as anticipated.  I, Jeniffer Culliver, PA-C have reviewed all documentation for this visit. The documentation on 12/04/2024  for the exam, diagnosis, procedures, and orders  are all accurate and complete.  Jolynn Spencer, [facility], MMS [facility] [phone] (phone) [phone] (fax)  [facility]     [1]  Outpatient  Medications Prior to Visit  Medication Sig   amLODipine  (NORVASC ) 2.5 MG tablet Take 1 tablet (2.5 mg total) by mouth daily.   Butalbital -APAP-Caffeine  50-300-40 MG CAPS TAKE 1 TO 2 CAPSULES BY MOUTH EVERY 6 HOURS AS NEEDED FOR HEADACHE(NO MORE THAN 6 TABS/DAY)   cholecalciferol (VITAMIN D) 1000 UNITS tablet Take by mouth.   cholestyramine  (QUESTRAN ) 4 g packet Take 1 packet (4 g total) by mouth 2 (two) times daily.   Coenzyme Q10 (COQ10) 100 MG CAPS Take 100 mg by mouth daily.   docusate sodium (COLACE) 100 MG capsule Take 100 mg by mouth daily.   ezetimibe  (ZETIA ) 10 MG tablet Take 1 tablet (10 mg total) by mouth daily.   famotidine  (PEPCID ) 20 MG tablet Take 1 tablet (20 mg total) by mouth 2 (two) times daily.   hydrocortisone  2.5 % cream Apply topically 2 (two) times daily.   Multiple Vitamin (MULTIVITAMIN WITH MINERALS) TABS tablet Take 1 tablet by mouth daily.   omeprazole  (PRILOSEC) 20 MG capsule Take 1 capsule by mouth once daily   pilocarpine (SALAGEN) 5 MG tablet Take 5 mg by mouth 3 (three) times daily.   pimecrolimus  (ELIDEL ) 1 % cream Apply to affected areas rash twice daily as needed for rash.   polyethylene glycol powder (GLYCOLAX/MIRALAX) 17 GM/SCOOP powder Take 1 Container by mouth once.   SUMAtriptan  (IMITREX ) 50 MG tablet Take 0.5 tablets (25 mg total) by mouth as needed.   vitamin B-12 (CYANOCOBALAMIN ) 500 MCG tablet Take 500 mcg by mouth every other day.   [DISCONTINUED] CIPRODEX  OTIC suspension 4 drops into left ear  twice daily   No facility-administered medications prior to visit.   "

## 2024-12-05 NOTE — Telephone Encounter (Signed)
 I don't know what Janna told her she was going to prescribe

## 2025-02-02 ENCOUNTER — Encounter: Admitting: Family Medicine

## 2025-02-05 ENCOUNTER — Encounter: Admitting: Family Medicine
